# Patient Record
Sex: Female | Born: 1945 | Race: White | Hispanic: No | Smoking: Former smoker
Health system: Southern US, Community
[De-identification: ages and names within clinical notes are randomized; demographics above are authoritative.]

## PROBLEM LIST (undated history)

## (undated) DIAGNOSIS — I471 Supraventricular tachycardia, unspecified: Secondary | ICD-10-CM

## (undated) DIAGNOSIS — E039 Hypothyroidism, unspecified: Secondary | ICD-10-CM

## (undated) DIAGNOSIS — I313 Pericardial effusion (noninflammatory): Secondary | ICD-10-CM

## (undated) DIAGNOSIS — F419 Anxiety disorder, unspecified: Secondary | ICD-10-CM

## (undated) DIAGNOSIS — I3139 Other pericardial effusion (noninflammatory): Secondary | ICD-10-CM

## (undated) DIAGNOSIS — C3490 Malignant neoplasm of unspecified part of unspecified bronchus or lung: Secondary | ICD-10-CM

## (undated) DIAGNOSIS — J449 Chronic obstructive pulmonary disease, unspecified: Secondary | ICD-10-CM

## (undated) DIAGNOSIS — K219 Gastro-esophageal reflux disease without esophagitis: Secondary | ICD-10-CM

## (undated) HISTORY — PX: ADENOIDECTOMY: SUR15

## (undated) HISTORY — PX: OTHER SURGICAL HISTORY: SHX169

## (undated) HISTORY — DX: Other pericardial effusion (noninflammatory): I31.39

## (undated) HISTORY — DX: Pericardial effusion (noninflammatory): I31.3

## (undated) HISTORY — DX: Malignant neoplasm of unspecified part of unspecified bronchus or lung: C34.90

---

## 2008-05-21 ENCOUNTER — Encounter: Payer: Self-pay | Admitting: Cardiology

## 2008-06-09 ENCOUNTER — Encounter: Payer: Self-pay | Admitting: Cardiology

## 2009-08-15 ENCOUNTER — Encounter: Payer: Self-pay | Admitting: Cardiology

## 2009-08-19 ENCOUNTER — Encounter: Payer: Self-pay | Admitting: Cardiology

## 2009-11-07 ENCOUNTER — Encounter: Payer: Self-pay | Admitting: Cardiology

## 2009-12-15 ENCOUNTER — Encounter: Payer: Self-pay | Admitting: Cardiology

## 2009-12-30 ENCOUNTER — Encounter: Payer: Self-pay | Admitting: Cardiology

## 2009-12-30 ENCOUNTER — Ambulatory Visit: Payer: Self-pay | Admitting: Cardiology

## 2010-01-30 ENCOUNTER — Ambulatory Visit: Payer: Self-pay | Admitting: Cardiology

## 2010-01-30 DIAGNOSIS — K219 Gastro-esophageal reflux disease without esophagitis: Secondary | ICD-10-CM | POA: Insufficient documentation

## 2010-01-30 DIAGNOSIS — Z87891 Personal history of nicotine dependence: Secondary | ICD-10-CM

## 2010-01-30 DIAGNOSIS — I10 Essential (primary) hypertension: Secondary | ICD-10-CM

## 2010-01-30 DIAGNOSIS — I319 Disease of pericardium, unspecified: Secondary | ICD-10-CM | POA: Insufficient documentation

## 2010-02-16 ENCOUNTER — Encounter: Payer: Self-pay | Admitting: Cardiology

## 2010-02-16 ENCOUNTER — Ambulatory Visit: Payer: Self-pay | Admitting: Cardiology

## 2010-02-21 ENCOUNTER — Ambulatory Visit: Payer: Self-pay | Admitting: Cardiology

## 2010-02-23 ENCOUNTER — Telehealth (INDEPENDENT_AMBULATORY_CARE_PROVIDER_SITE_OTHER): Payer: Self-pay | Admitting: *Deleted

## 2010-02-28 ENCOUNTER — Telehealth: Payer: Self-pay | Admitting: Cardiology

## 2010-02-28 ENCOUNTER — Encounter: Payer: Self-pay | Admitting: Cardiology

## 2010-04-20 ENCOUNTER — Encounter: Payer: Self-pay | Admitting: Cardiology

## 2010-04-24 ENCOUNTER — Encounter: Payer: Self-pay | Admitting: Cardiology

## 2010-04-25 ENCOUNTER — Encounter (INDEPENDENT_AMBULATORY_CARE_PROVIDER_SITE_OTHER): Payer: Self-pay | Admitting: *Deleted

## 2010-06-09 ENCOUNTER — Telehealth (INDEPENDENT_AMBULATORY_CARE_PROVIDER_SITE_OTHER): Payer: Self-pay | Admitting: *Deleted

## 2010-07-19 ENCOUNTER — Ambulatory Visit: Payer: Self-pay | Admitting: Cardiology

## 2010-07-19 DIAGNOSIS — F411 Generalized anxiety disorder: Secondary | ICD-10-CM | POA: Insufficient documentation

## 2010-08-16 ENCOUNTER — Encounter: Payer: Self-pay | Admitting: Cardiology

## 2010-08-17 ENCOUNTER — Encounter: Payer: Self-pay | Admitting: Cardiology

## 2010-09-05 NOTE — Assessment & Plan Note (Signed)
Summary: NP-PERICARDIAL EFFUSION   Visit Type:  Initial Consult Primary Provider:  Dr. [facility]  CC:  fluid around heart per Dr. Cleone Slim.  History of Present Illness: the patient is a 65 year old femalehistory of small cell undifferentiated lung cancer in remission. The patient is status post chemotherapy which ended in March of 2010. She also had radiation therapy February of 2010. She has undergone prophylactic whole brain radiation at the time of the completion of her chemotherapy. During followup CT scans of the chest showed no evidence of cancer or at least for the last several months has been noted to have a large pericardial effusion. It was felt that this would be related to chemoradiation therapy. The patient denies any pleuritic chest pain and has been no history of pericardial count on.  Dr. Suzzette Righter patient for evaluation of her pericardial effusion.her recent echocardiogram done abruptly one month ago showed a relatively large circumferential pericardial effusion. There was evidence for gelatinous organization on the visceral surface consistent with a chronic process. There was no right ventricular compression or obvious variation in mitral inflow suggestive of physiology. There was some right atrial free wall inversion. Inferior vena cava was normal in size. Clinically also the patient has no evidence of a non-physiology.  The patient has not been heard for assessment of her pericardial effusion.  Preventive Screening-Counseling & Management  Alcohol-Tobacco     Smoking Status: quit     Year Quit: 06/2008  Current Problems (verified): 1)  Pericardial Effusion  (ICD-423.9) 2)  Chemotherapy Follow-up Examination  (ICD-V67.2) 3)  Esophageal Reflux  (ICD-530.81) 4)  Hypertension  (ICD-401.9) 5)  Pers Hx Tobacco Use Presenting Hazards Health  (ICD-V15.82) 6)  Fm Hx Malignant Neoplasm Trachea Bronchus&lung  (ICD-V16.1)  Current Medications (verified): 1)   Singulair 10 Mg Tabs (Montelukast Sodium) .... Take 1 Tablet By Mouth Once A Day 2)  Spiriva Handihaler 18 Mcg Caps (Tiotropium Bromide Monohydrate) .... One Inhalation Daily 3)  Ventolin Hfa 108 (90 Base) Mcg/act Aers (Albuterol Sulfate) .... As Needed 4)  Dexilant 60 Mg Cpdr (Dexlansoprazole) .... Take 1 Tablet By Mouth Once A Day 5)  Amlodipine Besylate 5 Mg Tabs (Amlodipine Besylate) .... Take 1 Tablet By Mouth Once A Day 6)  Hydrocodone-Acetaminophen 5-500 Mg Tabs (Hydrocodone-Acetaminophen) .... Take One By Mouth Every Six Hours As Needed 7)  Remeron 30 Mg Tabs (Mirtazapine) .... Take 1 Tablet By Mouth Once A Day 8)  Alprazolam 0.5 Mg Tabs (Alprazolam) .... Take 1 Tablet By Mouth Two Times A Day As Needed 9)  Multivitamins  Tabs (Multiple Vitamin) .... Take 1 Tablet By Mouth Once A Day  Allergies (verified): 1)  ! Ibuprofen  Comments:  Nurse/Medical Assistant: The patient's medications and allergies were verbally reviewed with the patient and were updated in the Medication and Allergy Lists.  Past History:  Family History: Last updated: 02-25-2010 patient's father died of lung cancer. Paternal aunt had stomach cancer and her children smoke.  Social History: Last updated: 2010/02/25 patient had 2 years of college. She did well. 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  Risk Factors: Smoking Status: quit (25-Feb-2010)  Past Medical History: small cell undifferentiated lung cancer Status post chemoradiation therapy Status post prophylactic whole brain radiation. Selective and adenoidectomy 5 cataract surgery  Family History: Reviewed history and no changes required. patient's father died of lung cancer. Paternal aunt had stomach cancer and her children smoke.  Social History:  Reviewed history and no changes required. patient had 2 years of college. She did well. 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    Vital Signs:  Patient profile:   65 year old female Height:      61 inches Weight:      135 pounds BMI:     25.60 Pulse rate:   65 / minute BP sitting:   103 / 67  (left arm) Cuff size:   regular  Vitals Entered By: Carlye Grippe (January 30, 2010 10:43 AM)  Nutrition Counseling: Patient's BMI is greater than 25 and therefore counseled on weight management options. CC: fluid around heart per Dr. Cleone Slim   Physical Exam  Additional Exam:  General: Well-developed, well-nourished in no distress head: Normocephalic and atraumatic eyes PERRLA/EOMI intact, conjunctiva and lids normal nose: No deformity or lesions mouth normal dentition, normal posterior pharynx neck: Supple, no JVD.  No masses, thyromegaly or abnormal cervical nodes lungs: Normal breath sounds bilaterally without wheezing.  Normal percussion heart: regular rate and rhythm with normal S1 and S2, no S3 or S4.  PMI is normal.  No pathological murmurs abdomen: Normal bowel sounds, abdomen is soft and nontender without masses, organomegaly or hernias noted.  No hepatosplenomegaly musculoskeletal: Back normal, normal gait muscle strength and tone normal pulsus: Pulse is normal in all 4 extremities Extremities: No peripheral pitting edema neurologic: Alert and oriented x 3 skin: Intact without lesions or rashes cervical  nodes: No significant adenopathy psychologic: Normal affect    Impression & Recommendations:  Problem # 1:  PERICARDIAL EFFUSION (ICD-423.9) I had a long discussion with the patient regarding the possible etiology and management of her pericardial effusion. I told her that I would personally reviewed a recent echocardiogram. We will also order a followup echocardiogram in one month. The etiology of this effusion is not clear. Because the patient is in remission from her lung cancer it seems unlikely this would be a malignant effusion but certainly cannot be ruled out. It will also need to be followed from a hemodynamic perspective. I told the patient that I would have a low threshold to proceed with a subxiphoid window for drainage and diagnostic indications. We will make his decision on the followup echocardiogram in one month.  Problem # 2:  CHEMOTHERAPY FOLLOW-UP EXAMINATION (ICD-V67.2) the patient is followed in the oncology clinic  Problem # 3:  FM HX MALIGNANT NEOPLASM TRACHEA BRONCHUS&LUNG (ICD-V16.1) patient has small cell undifferentiated lung cancer in remission.  Other Orders: 2-D Echocardiogram (2D Echo)  Patient Instructions: 1)  2D Echo in one month just before next visit 2)  Follow up in  1 month

## 2010-09-05 NOTE — Letter (Signed)
Summary: Engineer, materials at [facility]  [address]   [place], [place] [address]   Phone: [phone]  Fax: [phone]        April 25, 2010 MRN: [id number]   [facility] [address] [place], [place]  [address]   Dear Ms. Seebeck,  Your test ordered by Selena Batten has been reviewed by your physician (or physician assistant) and was found to be normal or stable. Your physician (or physician assistant) felt no changes were needed at this time.  ____ Echocardiogram  ____ Cardiac Stress Test  ____ Lab Work  ____ Peripheral vascular study of arms, legs or neck  __X__ CT scan or X-ray (chest)  ____ Lung or Breathing test  ____ Other:   Thank you.   Hoover Brunette, LPN    Duane Boston, M.D., F.A.C.C. Thressa Sheller, M.D., F.A.C.C. Oneal Grout, M.D., F.A.C.C. Cheree Ditto, M.D., F.A.C.C. Daiva Nakayama, M.D., F.A.C.C. Kenney Houseman, M.D., F.A.C.C. Jeanne Ivan, PA-C

## 2010-09-05 NOTE — Letter (Signed)
Summary: External Correspondence/ [facility]  External Correspondence/ [facility]   Imported By: Dorise Hiss 04/26/2010 15:11:23  _____________________________________________________________________  External Attachment:    Type:   Image     Comment:   External Document

## 2010-09-05 NOTE — Progress Notes (Signed)
Summary: Referral pericardial drainage.  ---- Converted from flag ---- ---- 02/21/2010 2:00 PM, Lewayne Bunting, MD, [facility] wrote: Schedule subxyphoid window [facility]. Done ------------------------------

## 2010-09-05 NOTE — Progress Notes (Signed)
Summary: ?appt to remove fluid from her heart   Phone Note Outgoing Call Call back at Home Phone [phone]   Call placed by: Carlye Grippe,  February 23, 2010 11:39 AM Call placed to: Patient Summary of Call: called to inquire about PCP info and patient asked when her appt is going to be r/e removing fluid from her heart? Nurse informed patient that nothing has been scheduled at this time but we would f/u on this and let her know.  Initial call taken by: Carlye Grippe,  February 23, 2010 11:41 AM  Follow-up for Phone Call        Let her know I treied to call Dr. Pat Patrick with [facility] in [place]. He has not called me back will try again tomorrow. Please let her know I am working on it to get her Careers adviser in [place].  Follow-up by: Lewayne Bunting, MD, [facility],  February 23, 2010 4:47 PM  Additional Follow-up for Phone Call Additional follow up Details #1::        Patient informed of the above.  Additional Follow-up by: Carlye Grippe,  February 24, 2010 10:32 AM

## 2010-09-05 NOTE — Assessment & Plan Note (Signed)
Summary: 1 MONTH FU-REVIEW ECHO   Visit Type:  Follow-up Primary Provider:  Dr. Amend([facility]   History of Present Illness: the patient is a 65 year old female with a history of small cell undifferentiated lung cancer which is in remission. Please see my previous note regarding details of her chemotherapy and radiation therapy. The patient has been referred last month for an evaluation of a pericardial effusion. She was found to have a large circumferential effusion with gelatinous organization on the visceral surface consistent with a chronic process. There was no right compression or obvious variation in mitral inflow pattern suggestive of time but not physiology. There was some right atrial free wall inversion. The patient had a repeat study done on July 14 which was essentially unchanged. In the interim however the patient's blood pressure has gradually decreased today in the office was 92/61. I also performed a pulsus paradoxus with a blood pressure cuff and it was approximately 20 mm of mercury. The patient is not tachycardic and she reports no dizziness or orthostatic symptoms.  Etiology of the patient's pericardial effusion is not clear, it would be somewhat unusual if this was a malignant effusion in light of her cancer being in remission. However I told the patient that there does appear to be some effect of this effusion on her blood pressure i.e. increased pulsus paradoxus despite the negative echocardiographic criteria. I told her that I do not anticipate this effusion will get smaller spontaneously. I still question whether this could be a malignant effusion.  Preventive Screening-Counseling & Management  Alcohol-Tobacco     Smoking Status: quit  Comments: quit smoking Nov 2009, smoked for about 40 yrs. chews nicotine gum  Current Medications (verified): 1)  Singulair 10 Mg Tabs (Montelukast Sodium) .... Take 1 Tablet By Mouth Once A Day 2)  Spiriva Handihaler 18  Mcg Caps (Tiotropium Bromide Monohydrate) .... One Inhalation Daily 3)  Ventolin Hfa 108 (90 Base) Mcg/act Aers (Albuterol Sulfate) .... As Needed 4)  Dexilant 60 Mg Cpdr (Dexlansoprazole) .... Take 1 Tablet By Mouth Once A Day 5)  Hydrocodone-Acetaminophen 5-500 Mg Tabs (Hydrocodone-Acetaminophen) .... Take One By Mouth Every Six Hours As Needed 6)  Remeron 15 Mg Tabs (Mirtazapine) .... Take 1 Tablet By Mouth Once A Day 7)  Alprazolam 0.5 Mg Tabs (Alprazolam) .... Take 1 Tablet By Mouth Two Times A Day As Needed 8)  Multivitamins  Tabs (Multiple Vitamin) .... Take 1 Tablet By Mouth Once A Day  Allergies: 1)  ! Ibuprofen  Comments:  Nurse/Medical Assistant: The patient's medications were reviewed with the patient and were updated in the Medication List. Pt verbally confirmed medications.  Cyril Loosen, RN, BSN (February 21, 2010 1:26 PM)  Past History:  Past Medical History: Last updated: February 03, 2010 small cell undifferentiated lung cancer Status post chemoradiation therapy Status post prophylactic whole brain radiation. Selective and adenoidectomy 5 cataract surgery  Past Surgical History: Last updated: 2010-02-03 Cataract Extraction Tonsillectomy LEEP TWICE FOR ABNORMALITIES OF HER CERVIX Chemotherapy  Family History: Last updated: 02/03/2010 patient's father died of lung cancer. Paternal aunt had stomach cancer and her children smoke.  Social History: Last updated: 02/03/10 patient had 2 years of college. She did well. 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  Review of Systems  The patient denies fatigue, malaise, fever, weight gain/loss, vision loss, decreased hearing, hoarseness, chest pain, palpitations, shortness of breath, prolonged cough, wheezing, sleep apnea, coughing up blood,  abdominal pain, blood in stool, nausea, vomiting, diarrhea, heartburn, incontinence, blood in urine, muscle weakness,  joint pain, leg swelling, rash, skin lesions, headache, fainting, dizziness, depression, anxiety, enlarged lymph nodes, easy bruising or bleeding, and environmental allergies.    Vital Signs:  Patient profile:   65 year old female Height:      61 inches Weight:      134.50 pounds BMI:     25.51 Pulse rate:   73 / minute BP sitting:   92 / 61  (left arm) Cuff size:   regular  Vitals Entered By: Cyril Loosen, RN, BSN (February 21, 2010 1:22 PM)  Nutrition Counseling: Patient's BMI is greater than 25 and therefore counseled on weight management options. Comments follow up echo   Physical Exam  Additional Exam:  General: Well-developed, well-nourished in no distress, also start oxacillin 2 mmHg head: Normocephalic and atraumatic eyes PERRLA/EOMI intact, conjunctiva and lids normal nose: No deformity or lesions mouth normal dentition, normal posterior pharynx neck: Supple, no JVD.  No masses, thyromegaly or abnormal cervical nodes lungs: Normal breath sounds bilaterally without wheezing.  Normal percussion heart: regular rate and rhythm with normal S1 and S2, no S3 or S4.  PMI is normal.  No pathological murmurs abdomen: Normal bowel sounds, abdomen is soft and nontender without masses, organomegaly or hernias noted.  No hepatosplenomegaly musculoskeletal: Back normal, normal gait muscle strength and tone normal pulsus: Pulse is normal in all 4 extremities Extremities: No peripheral pitting edema neurologic: Alert and oriented x 3 skin: Intact without lesions or rashes cervical nodes: No significant adenopathy psychologic: Normal affect    EKG  Procedure date:  02/21/2010  Findings:      low-voltage QRS normal sinus rhythm. Heart rate 73 beats per minute  Impression & Recommendations:  Problem # 1:  PERICARDIAL EFFUSION (ICD-423.9) I told the patient that I would recommend a diagnostic and possibly therapeutic pericardial drainage procedure. The patient does not want to have  this done in [place] due to the distance from [place]. I told her that Dr. Lady Gary a former colleague of mine , and probably could refer me to a cardiovascular surgeon in [place]. I do not feel this should be approached by a needle pericardiocentesis but rather to a surgical subxiphoid window approach. I discussed some of the risks associated with this procedure but the patient ultimately she needs to have a consultation with cardiac surgeon. I will try to set this up at the earliest convenient time for the patient.we will also stop amlodipine in the meanwhile Orders: EKG w/ Interpretation (93000)  Problem # 2:  FM HX MALIGNANT NEOPLASM TRACHEA BRONCHUS&LUNG (ICD-V16.1) see details in my prior note.  Patient Instructions: 1)  Stop Amlodipine 2)  Follow up in  2 months

## 2010-09-05 NOTE — Letter (Signed)
Summary: External Correspondence/ PROGRESS REPORT [facility]   External Correspondence/ PROGRESS REPORT [facility]   Imported By: Dorise Hiss 12/27/2009 15:00:03  _____________________________________________________________________  External Attachment:    Type:   Image     Comment:   External Document

## 2010-09-05 NOTE — Consult Note (Signed)
Summary: Consultation Report/ ONCOLOGY  Consultation Report/ ONCOLOGY   Imported By: Dorise Hiss 12/28/2009 10:32:28  _____________________________________________________________________  External Attachment:    Type:   Image     Comment:   External Document

## 2010-09-05 NOTE — Letter (Signed)
Summary: External Correspondence/ FAXED [facility]  External Correspondence/ [facility]XED [facility]   Imported By: Dorise Hiss 03/17/2010 10:04:34  _____________________________________________________________________  External Attachment:    Type:   Image     Comment:   External Document

## 2010-09-05 NOTE — Progress Notes (Signed)
Summary: patient cxl appt  Phone Note Call from Patient Call back at Home Phone [phone]   Caller: Patient Reason for Call: Talk to Nurse Summary of Call: patient called to cxl appt due to getting a letter from "[place]?" stating that we no longer need to see her... Initial call taken by: Claudette Laws,  June 09, 2010 12:33 PM  Follow-up for Phone Call        Advised pt that the only letter documented in her chart was a normal letter for her chest x-ray.  Advised her that she did need to keep OV for 11/8.  Stated she has appt. with GYN and would need to reschedule.  Rescheduled for 12/14.  Also, states that surgeon does not need to see her anymore.   Follow-up by: Hoover Brunette, LPN,  June 12, 2010 11:19 AM

## 2010-09-05 NOTE — Miscellaneous (Signed)
Summary: Echocardiogram  Clinical Lists Changes  Observations: Added new observation of ECHOINTERP: Conclusions: 1. The left ventricular chamber size is normal. There is no left ventricular hypertrophy. Global left ventricular wall motion and contractility are within normal limits. The estimated ejection fraction is 60-65%. Abnormal left ventricular diastolic filling is observed, consistent with impaired relaxation.   2. The mitral valve leaflets are mildly thickened. There is flattened closure of the mitral valve leaflets. There is mild mitral regurgitation.   3. The aortic valve leaflets are mildly thickened. There is mild aortic regurgitation. The mean gradient of the aortic valve is 6 mmHg.   4. There is a trace tricuspid regurgitation.   5. There is a large circumferential pericardial effusion with some gelatinous organization at visceral surface. No obvious significant changes in mitral inflow to suggest hemodynamic compromise. No right ventricular collapse. Mild to moderate right atrial free wall undulation without collapse. Could not locate last study for direct comparison - previously described as large.  6. There is a greater than 50% respiratory change in the inferior vena cava dimension.    (02/16/2010 12:07)      Echocardiogram  Procedure date:  02/16/2010  Findings:      Conclusions: 1. The left ventricular chamber size is normal. There is no left ventricular hypertrophy. Global left ventricular wall motion and contractility are within normal limits. The estimated ejection fraction is 60-65%. Abnormal left ventricular diastolic filling is observed, consistent with impaired relaxation.   2. The mitral valve leaflets are mildly thickened. There is flattened closure of the mitral valve leaflets. There is mild mitral regurgitation.   3. The aortic valve leaflets are mildly thickened. There is mild aortic regurgitation. The mean gradient of the aortic valve is 6 mmHg.   4. There is  a trace tricuspid regurgitation.   5. There is a large circumferential pericardial effusion with some gelatinous organization at visceral surface. No obvious significant changes in mitral inflow to suggest hemodynamic compromise. No right ventricular collapse. Mild to moderate right atrial free wall undulation without collapse. Could not locate last study for direct comparison - previously described as large.  6. There is a greater than 50% respiratory change in the inferior vena cava dimension.     Appended Document: Echocardiogram will need to review echocardiogram.  However, make an appointment for patient weight cardiac surgery for diagnostic and drainage of a large pericardial effusion via a subxiphoid approach.  Patient can discuss either in the office or on the phone.  He can report to her that I feel this effusion needs to be drained to make sure is not malignant and also because it's quite large.  Appended Document: Echocardiogram Left message to return call.   Appended Document: Echocardiogram Patient in office on 7/19.

## 2010-09-07 NOTE — Letter (Signed)
Summary: External Correspondence/ [facility]  External Correspondence/ [facility]   Imported By: Dorise Hiss 08/18/2010 10:01:07  _____________________________________________________________________  External Attachment:    Type:   Image     Comment:   External Document

## 2010-09-07 NOTE — Assessment & Plan Note (Signed)
Summary: [id number]   Visit Type:  Follow-up Primary Provider:  Dr. Amin([facility]   History of Present Illness: the patient is a 65 old female with a history of small cell undifferentiated lung cancer which is in remission. She has finished her chemoradiation therapy now more than a year ago. We have been following the patient for a chronic pericardial effusion which has been described in the past last large with gelatinous organization on the visceral surface consistent with a chronic process. Previously there was no evidence of time but not.due to my concern of a possible malignant effusion I referred patient to cardiac thoracic surgery in [facility] to see Dr. Lindie Spruce for consideration of a diagnostic and therapeutic pericardial drainage procedure. A repeat CT scan was obtained and it was felt that the effusion was smaller and therefore decreased patient was treated conservatively and no drainage procedure was performed. Please see the patient's blood pressure been running somewhat low but does seem to have self corrected itself. We did stop previously her Norvasc. The patient reports no palpitations presyncope or syncope.  The patient's main complaint is fatigue and decreased appetite. She has frequent ruminating thoughts and feels torn between her family in [place] and hearing even. She also has difficulty sleeping. The patient exhibits some features of agoraphobia. She has frequent anxiety episodes.  Cardiovascular standpoint however she seems to be doing well. She reports no chest pain shortness of breath orthopnea or PND.  Preventive Screening-Counseling & Management  Alcohol-Tobacco     Smoking Status: quit     Year Quit: 2009   Current Medications (verified): 1)  Singulair 10 Mg Tabs (Montelukast Sodium) .... Take 1 Tablet By Mouth Once A Day 2)  Spiriva Handihaler 18 Mcg Caps (Tiotropium Bromide Monohydrate) .... One Inhalation Daily 3)  Ventolin Hfa 108 (90 Base) Mcg/act Aers  (Albuterol Sulfate) .... As Needed 4)  Dexilant 60 Mg Cpdr (Dexlansoprazole) .... Take 1 Tablet By Mouth Once A Day 5)  Hydrocodone-Acetaminophen 5-500 Mg Tabs (Hydrocodone-Acetaminophen) .... Take One By Mouth Every Six Hours As Needed 6)  Remeron 15 Mg Tabs (Mirtazapine) .... Take 1 Tablet By Mouth Once A Day 7)  Alprazolam 0.5 Mg Tabs (Alprazolam) .... Take 1 Tablet By Mouth Two Times A Day As Needed 8)  Multivitamins  Tabs (Multiple Vitamin) .... Take 1 Tablet By Mouth Once A Day  Allergies (verified): 1)  ! Ibuprofen  Comments:  Nurse/Medical Assistant: The patient's medications and allergies were verbally reviewed with the patient and were updated in the Medication and Allergy Lists.  Past History:  Past Surgical History: Last updated: Feb 07, 2010 Cataract Extraction Tonsillectomy LEEP TWICE FOR ABNORMALITIES OF HER CERVIX Chemotherapy  Family History: Last updated: 02-07-2010 patient's father died of lung cancer. Paternal aunt had stomach cancer and her children smoke.  Social History: Last updated: 07-Feb-2010 patient had 2 years of college. She did well. 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  Risk Factors: Smoking Status: quit (07/19/2010)  Past Medical History: small cell undifferentiated lung cancer Status post chemoradiation therapy Status post prophylactic whole brain radiation. Selective and adenoidectomy 5 cataract surgery Chronic pericardial effusion evaluated by cardiothoracic surgery July 2011 with decision to follow clinically  Review of Systems       The patient complains of fatigue, malaise, weight gain/loss, and anxiety.  The patient denies fever, vision loss, decreased hearing, hoarseness, chest pain, palpitations, shortness of breath, prolonged cough, wheezing, sleep  apnea, coughing up blood, abdominal pain, blood in stool, nausea, vomiting, diarrhea, heartburn, incontinence, blood  in urine, muscle weakness, joint pain, leg swelling, rash, skin lesions, headache, fainting, dizziness, depression, enlarged lymph nodes, easy bruising or bleeding, and environmental allergies.    Vital Signs:  Patient profile:   65 year old female Height:      61 inches Weight:      127 pounds Pulse rate:   77 / minute BP sitting:   130 / 74  (left arm) Cuff size:   regular  Vitals Entered By: Carlye Grippe (July 19, 2010 9:33 AM)  Physical Exam  Additional Exam:  General: Well-developed, well-nourished in no distress, also start oxacillin 2 mmHg head: Normocephalic and atraumatic eyes PERRLA/EOMI intact, conjunctiva and lids normal nose: No deformity or lesions mouth normal dentition, normal posterior pharynx neck: Supple, no JVD.  No masses, thyromegaly or abnormal cervical nodes lungs: Normal breath sounds bilaterally without wheezing.  Normal percussion heart: regular rate and rhythm with normal S1 and S2, no S3 or S4.  PMI is normal.  No pathological murmurs abdomen: Normal bowel sounds, abdomen is soft and nontender without masses, organomegaly or hernias noted.  No hepatosplenomegaly musculoskeletal: Back normal, normal gait muscle strength and tone normal pulsus: Pulse is normal in all 4 extremities Extremities: No peripheral pitting edema neurologic: Alert and oriented x 3 skin: Intact without lesions or rashes cervical nodes: No significant adenopathy psychologic: Normal affect    Impression & Recommendations:  Problem # 1:  PERICARDIAL EFFUSION (ICD-423.9) the patient will have a follow up echocardiogram in January to make sure her pericardial effusion has not enlarged. Her lung cancer appears to be in remission.there appeared to be no untoward hemodynamic effects. Orders: 2-D Echocardiogram (2D Echo)  Problem # 2:  HYPERTENSION (ICD-401.9) the patient previously was hypotensive. Her blood pressure has normalized. She remains off Norvasc.  Problem # 3:  FM  HX MALIGNANT NEOPLASM TRACHEA BRONCHUS&LUNG (ICD-V16.1) the patient is followed by oncology. She appears to be in remission  Problem # 4:  ANXIETY DISORDER (ICD-300.00) the patient has a significant anxiety disorder with ruminating thoughts, agoraphobia associated fatigue and decreased appetite. I told her that p.r.n. Xanax is not ideal treatment due to the fact that it's short-acting. I recommended that she discuss with her primary care physician to take clonazepam or an alternative long acting benzodiazepine twice a day. She also significant insomnia and her Remeron can be increased or she could be switched to trazodone. I asked the patient to discuss this with her primary care physician.  Patient Instructions: 1)  Echo on 1/11/201 2)  Change meds as suggested - give note to PMD. 3)  Follow up in  6 months

## 2010-09-20 ENCOUNTER — Encounter: Payer: Self-pay | Admitting: Cardiology

## 2010-09-21 ENCOUNTER — Encounter: Payer: Self-pay | Admitting: Cardiology

## 2010-10-12 NOTE — Letter (Signed)
Summary: External Correspondence/ [facility]  External Correspondence/ [facility]   Imported By: Dorise Hiss 10/04/2010 12:25:56  _____________________________________________________________________  External Attachment:    Type:   Image     Comment:   External Document

## 2011-01-18 ENCOUNTER — Encounter: Payer: Self-pay | Admitting: Cardiology

## 2011-03-01 ENCOUNTER — Ambulatory Visit (INDEPENDENT_AMBULATORY_CARE_PROVIDER_SITE_OTHER): Payer: Medicare Other | Admitting: Cardiology

## 2011-03-01 ENCOUNTER — Encounter: Payer: Self-pay | Admitting: Cardiology

## 2011-03-01 VITALS — BP 113/75 | HR 72 | Ht 61.0 in | Wt 151.5 lb

## 2011-03-01 DIAGNOSIS — I319 Disease of pericardium, unspecified: Secondary | ICD-10-CM

## 2011-03-01 DIAGNOSIS — C3490 Malignant neoplasm of unspecified part of unspecified bronchus or lung: Secondary | ICD-10-CM

## 2011-03-01 DIAGNOSIS — I10 Essential (primary) hypertension: Secondary | ICD-10-CM

## 2011-03-01 NOTE — Patient Instructions (Signed)
   Patient will call back when ready to move forward with pericardial drainage. Your physician wants you to follow up in: 6 months.  You will receive a reminder letter in the mail one-two months in advance.  If you don't receive a letter, please call our office to schedule the follow up appointment

## 2011-03-06 DIAGNOSIS — C3490 Malignant neoplasm of unspecified part of unspecified bronchus or lung: Secondary | ICD-10-CM | POA: Insufficient documentation

## 2011-03-06 NOTE — Assessment & Plan Note (Signed)
Status post recent CT scan for followup stable. Followup oncology

## 2011-03-06 NOTE — Assessment & Plan Note (Signed)
Blood pressure well controlled. No change in medications 

## 2011-03-06 NOTE — Progress Notes (Signed)
HPI The patient is a 65 year old female with a history of small undifferentiated lung cancer which is in remission. She completed chemotherapy almost 2 years ago. The patient has a chronic pericardial effusion which in the past has been described as large with gelatinous organization on the visceral surface consistent with a chronic process. There has always been the concern of a malignant effusion. Asher referred in the past the patient to [facility] as per her preference for consideration for diagnostic and therapeutic pericardial drainage procedure. After [facility] however repeat CT scan was obtained was felt that the effusion smaller and therefore a decision was made to treat the patient conservatively in cardiac surgery at [facility] decided not to perform any further procedures. In the past her blood pressure has been low and I stopped Norvasc. She now follows up for a routine visit. She had a recent CT scan done in June of 2012 which again described a large pericardial effusion. There was some resolution of prior note groundglass opacities within the right medial metoprolol he was also scarring adjacent to the left hilum and in the superior segment of the left lower lobe which was felt to be stable and there was a remaining tiny right pleural effusion. The patient reports chronic dyspnea. Particularly during hot weather increase humidity she feels quite short of breath. It appears that her symptoms have somewhat worsened compared to her last visit. I did a bedside echocardiogram showed an ejection fraction of 60-65% with a large circumferential pericardial effusion which measures laterally 2.7 cm. There was no obvious RV collapse during diastole but this was a limited study performed with the pocket size GE V-scan.   Allergies  Allergen Reactions  . Ibuprofen     REACTION: throat swelling    Current Outpatient Prescriptions on File Prior to Visit  Medication Sig Dispense Refill  . albuterol (VENTOLIN  HFA) 108 (90 BASE) MCG/ACT inhaler Inhale 1 puff into the lungs as needed.        . ALPRAZolam (XANAX) 0.5 MG tablet Take 0.5 mg by mouth 3 (three) times daily as needed.       Marland Kitchen dexlansoprazole (DEXILANT) 60 MG capsule Take 60 mg by mouth daily.        Marland Kitchen HYDROcodone-acetaminophen (VICODIN) 5-500 MG per tablet Take 1 tablet by mouth every 6 (six) hours as needed.        . montelukast (SINGULAIR) 10 MG tablet Take 10 mg by mouth daily.        . Multiple Vitamins-Minerals (MULTIVITAL) tablet Take 1 tablet by mouth daily.        Marland Kitchen tiotropium (SPIRIVA HANDIHALER) 18 MCG inhalation capsule Place 18 mcg into inhaler and inhale daily.          Past Medical History  Diagnosis Date  . Lung cancer     small cell undifferentiated  . S/P adenoidectomy   . S/P cataract surgery     5x  . Pericardial effusion     No past surgical history on file.  Family History  Problem Relation Age of Onset  . Lung cancer Father   . Stomach cancer Paternal Aunt     History   Social History  . Marital Status: Divorced    Spouse Name: N/A    Number of Children: N/A  . Years of Education: N/A   Occupational History  . Not on file.   Social History Main Topics  . Smoking status: Former Smoker    Types: Cigarettes    Quit  date: 06/06/2008  . Smokeless tobacco: Never Used  . Alcohol Use: No  . Drug Use: No  . Sexually Active: Not on file   Other Topics Concern  . Not on file   Social History Narrative   Patient had 2 years of college and did well. 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    [id number] positives as outlined above. The remainder of the 18  point review of systems is negative  PHYSICAL EXAM BP 113/75  Pulse 72  Ht 5\' 1"  (1.549 m)  Wt 151 lb 8 oz (68.72 kg)  BMI 28.63 kg/m2  General: Well-developed, well-nourished in no distress Head: Normocephalic and atraumatic Eyes:PERRLA/EOMI intact, conjunctiva and lids  normal Ears: No deformity or lesions Mouth:normal dentition, normal posterior pharynx Neck: Supple, JVP is approximately 9 cm. There is abdominal jugular reflux.  No masses, thyromegaly or abnormal cervical nodes Lungs: Normal breath sounds bilaterally without wheezing.  Normal percussion Cardiac: regular rate and rhythm with normal S1 and S2, no S3 or S4.  PMI is normal.  No pathological murmurs Abdomen: Normal bowel sounds, abdomen is soft and nontender without masses, organomegaly or hernias noted.  No hepatosplenomegaly MSK: Back normal, normal gait muscle strength and tone normal Vascular: Pulse is normal in all 4 extremities Extremities: No peripheral pitting edema Neurologic: Alert and oriented x 3 Skin: Intact without lesions or rashes Lymphatics: No significant adenopathy Psychologic: Normal affect   ECG: Normal sinus rhythm low-voltage EKG  ASSESSMENT AND PLAN

## 2011-03-06 NOTE — Assessment & Plan Note (Signed)
The patient's pericardial effusion remains very large. There is no obvious hemodynamic compromise because of its chronicity. However given the patient's prior history of malignancy I still feel it is imperative that we proceed with pericardial drainage which can be best accomplished with a subxiphoid window. The effusion sufficiently large enough at this point in time that there is no real reason for further delay in at least removing the effusion. I think this can be done at low risk and I will refer the patient to cardiac surgery for consultation.

## 2011-05-01 ENCOUNTER — Telehealth: Payer: Self-pay | Admitting: Cardiology

## 2011-05-01 NOTE — Telephone Encounter (Signed)
Patient call me on cell phone this morning. She wants to proceed with pericardial drainage and a consultation visit with cardiothoracic surgery. Please arrange as soon as possible.

## 2011-05-07 NOTE — Telephone Encounter (Signed)
Has this referral been done?

## 2011-05-08 NOTE — Telephone Encounter (Signed)
Patient notified that her referral will be sent today to [facility] in [place].  They will contact her with appointment.

## 2011-05-09 ENCOUNTER — Other Ambulatory Visit: Payer: Self-pay | Admitting: *Deleted

## 2011-05-09 DIAGNOSIS — I313 Pericardial effusion (noninflammatory): Secondary | ICD-10-CM

## 2011-05-18 ENCOUNTER — Telehealth: Payer: Self-pay | Admitting: *Deleted

## 2011-05-18 DIAGNOSIS — I313 Pericardial effusion (noninflammatory): Secondary | ICD-10-CM

## 2011-05-18 NOTE — Telephone Encounter (Signed)
Patient needs to have formal echo before seeing cardiothoracic surgery for pericardial effusion.    10/5 - 12:05 - voice mail not set up  Left message to return call.

## 2011-05-23 ENCOUNTER — Other Ambulatory Visit: Payer: Self-pay | Admitting: Cardiology

## 2011-05-23 ENCOUNTER — Encounter: Payer: Self-pay | Admitting: *Deleted

## 2011-05-23 ENCOUNTER — Telehealth: Payer: Self-pay | Admitting: *Deleted

## 2011-05-23 ENCOUNTER — Telehealth: Payer: Self-pay | Admitting: Cardiology

## 2011-05-23 DIAGNOSIS — I313 Pericardial effusion (noninflammatory): Secondary | ICD-10-CM

## 2011-05-23 NOTE — Telephone Encounter (Signed)
Called Tina [name] to schedule her 2 D ECHO and she ask if she could have it done at [facility]. I scheduled her for 05-25-2011 @ 10AM. Verified with patient by telephone and she was in agreement with The date and time.

## 2011-05-23 NOTE — Telephone Encounter (Signed)
2 D ECHO scheduled for 05-25-2011 @ [facility] Checking percert

## 2011-05-23 NOTE — Telephone Encounter (Signed)
Epic shows Medicare and Medicare supplement.  No precert required.

## 2011-05-23 NOTE — Telephone Encounter (Signed)
Patient left message on voice mail to send any info for her in the mail.  She is having phone problems.    Will mail letter regarding below.

## 2011-05-25 DIAGNOSIS — I32 Pericarditis in diseases classified elsewhere: Secondary | ICD-10-CM

## 2011-05-29 ENCOUNTER — Ambulatory Visit: Payer: Medicare Other | Admitting: Cardiovascular Disease

## 2011-05-31 ENCOUNTER — Ambulatory Visit (INDEPENDENT_AMBULATORY_CARE_PROVIDER_SITE_OTHER): Payer: Medicare Other | Admitting: Cardiovascular Disease

## 2011-05-31 ENCOUNTER — Encounter: Payer: Self-pay | Admitting: Cardiovascular Disease

## 2011-05-31 ENCOUNTER — Encounter: Payer: Self-pay | Admitting: *Deleted

## 2011-05-31 DIAGNOSIS — I309 Acute pericarditis, unspecified: Secondary | ICD-10-CM

## 2011-05-31 DIAGNOSIS — R0602 Shortness of breath: Secondary | ICD-10-CM

## 2011-05-31 DIAGNOSIS — I319 Disease of pericardium, unspecified: Secondary | ICD-10-CM

## 2011-05-31 NOTE — Patient Instructions (Signed)
Your physician recommends that you go to the [facility] for lab work: DO TODAY! Pericardiocentesis scheduled at [facility] on Wed, June 06, 2011. Please see attached instruction sheet.

## 2011-05-31 NOTE — Progress Notes (Signed)
HPI  This is a 65 year old female who is here today for a followup visit regarding her pericardial effusion. She has a history of lung cancer which was diagnosed in 2010. It was treated with radiation and chemotherapy and has been in remission since then. She was noted on CT scans on multiple occasions to have large pericardial effusion. This has been followed by Dr. Earnestine Leys. She had an echocardiogram done last week which showed progression of the size of the effusion from before with early echocardiographic signs of tamponade. The etiology of the effusion is still not clear. However, she was diagnosed recently with severe hypothyroidism with a TSH above 100. She was started on Synthroid early this month. The patient has significant dyspnea with activities and also has been complaining of dizziness. There has been no syncope. She gets chest pain if she lies on her left side.  Allergies  Allergen Reactions  . Ibuprofen     REACTION: throat swelling     Current Outpatient Prescriptions on File Prior to Visit  Medication Sig Dispense Refill  . albuterol (VENTOLIN HFA) 108 (90 BASE) MCG/ACT inhaler Inhale 1 puff into the lungs as needed.        . ALPRAZolam (XANAX) 0.5 MG tablet Take 0.5 mg by mouth 3 (three) times daily as needed.       Marland Kitchen dexlansoprazole (DEXILANT) 60 MG capsule Take 60 mg by mouth daily as needed.       Marland Kitchen HYDROcodone-acetaminophen (VICODIN) 5-500 MG per tablet Take 1 tablet by mouth every 6 (six) hours as needed.        . montelukast (SINGULAIR) 10 MG tablet Take 10 mg by mouth daily.        . Multiple Vitamins-Minerals (MULTIVITAL) tablet Take 1 tablet by mouth daily.        Marland Kitchen tiotropium (SPIRIVA HANDIHALER) 18 MCG inhalation capsule Place 18 mcg into inhaler and inhale daily.           Past Medical History  Diagnosis Date  . Lung cancer     small cell undifferentiated  . S/P adenoidectomy   . S/P cataract surgery     5x  . Pericardial effusion      History reviewed.  No pertinent past surgical history.   Family History  Problem Relation Age of Onset  . Lung cancer Father   . Stomach cancer Paternal Aunt      History   Social History  . Marital Status: Divorced    Spouse Name: N/A    Number of Children: N/A  . Years of Education: N/A   Occupational History  . Not on file.   Social History Main Topics  . Smoking status: Former Smoker -- 1.0 packs/day for 30 years    Types: Cigarettes    Quit date: 06/06/2008  . Smokeless tobacco: Never Used  . Alcohol Use: No  . Drug Use: No  . Sexually Active: Not on file   Other Topics Concern  . Not on file   Social History Narrative   Patient had 2 years of college and did well. 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      PHYSICAL EXAM   BP 131/85  Pulse 72  Ht 5\' 1"  (1.549 m)  Wt 155 lb (70.308 kg)  BMI 29.29 kg/m2  SpO2 97%  Constitutional: She is oriented to person, place, and time. She appears well-developed and well-nourished. No distress.  HENT: No nasal discharge.  Head: Normocephalic and atraumatic.  Eyes: Pupils are equal, round, and reactive to light. Right eye exhibits no discharge. Left eye exhibits no discharge.  Neck: Normal range of motion. Neck supple. Mild JVD present. No thyromegaly present.  Cardiovascular: Normal rate, regular rhythm, normal heart sounds and intact distal pulses. Exam reveals no gallop and no friction rub.  No murmur heard.  Pulmonary/Chest: Effort normal and breath sounds normal. No stridor. No respiratory distress. She has no wheezes. She has no rales. She exhibits no tenderness.  Abdominal: Soft. Bowel sounds are normal. She exhibits no distension. There is no tenderness. There is no rebound and no guarding.  Musculoskeletal: Normal range of motion. She exhibits no edema and no tenderness.  Neurological: She is alert and oriented to person, place, and time. Coordination normal.    Skin: Skin is warm and dry. No rash noted. She is not diaphoretic. No erythema. No pallor.  Psychiatric: She has a normal mood and affect. Her behavior is normal. Judgment and thought content normal.      ASSESSMENT AND PLAN

## 2011-05-31 NOTE — Assessment & Plan Note (Signed)
The patient has very large pericardial effusion with early echocardiographic signs of tamponade. Clinically, there is no pulsus paradoxus by physical exam. She appears to be significantly dyspneic with symptoms of dizziness as well. The etiology of the effusion is still not clear but it could be due to hypothyroidism. Due to large size of the effusion with early hemodynamic compromise an echo as well as her significant symptoms of dyspnea and dizziness, I recommend proceeding with a diagnostic and therapeutic pericardiocentesis. Risks and benefits as well as alternatives were discussed with the patient.

## 2011-06-01 ENCOUNTER — Telehealth: Payer: Self-pay | Admitting: *Deleted

## 2011-06-01 NOTE — Telephone Encounter (Signed)
No precert required 

## 2011-06-01 NOTE — Telephone Encounter (Signed)
Pericardiocentesis at [facility] with Echo Tech present Main lab Dr. Kirke Corin 06/06/11 arrive 1030 for [id number] case   CHECKING PERCERT

## 2011-06-05 ENCOUNTER — Ambulatory Visit: Payer: Medicare Other | Admitting: Cardiovascular Disease

## 2011-06-05 ENCOUNTER — Encounter (HOSPITAL_COMMUNITY): Payer: Self-pay | Admitting: Pharmacy Technician

## 2011-06-06 ENCOUNTER — Inpatient Hospital Stay (HOSPITAL_COMMUNITY)
Admission: RE | Admit: 2011-06-06 | Discharge: 2011-06-08 | DRG: 316 | Disposition: A | Discharge status: Home / Self Care | Payer: Medicare Other | Source: Ambulatory Visit | Attending: Cardiovascular Disease | Admitting: Cardiovascular Disease

## 2011-06-06 ENCOUNTER — Other Ambulatory Visit: Payer: Self-pay | Admitting: Internal Medicine

## 2011-06-06 DIAGNOSIS — I517 Cardiomegaly: Secondary | ICD-10-CM

## 2011-06-06 DIAGNOSIS — I498 Other specified cardiac arrhythmias: Secondary | ICD-10-CM | POA: Diagnosis present

## 2011-06-06 DIAGNOSIS — Z85118 Personal history of other malignant neoplasm of bronchus and lung: Secondary | ICD-10-CM

## 2011-06-06 DIAGNOSIS — I319 Disease of pericardium, unspecified: Principal | ICD-10-CM | POA: Diagnosis present

## 2011-06-06 DIAGNOSIS — E039 Hypothyroidism, unspecified: Secondary | ICD-10-CM | POA: Diagnosis present

## 2011-06-06 LAB — GLUCOSE, SEROUS FLUID: Glucose, Fluid: 63 mg/dL

## 2011-06-06 LAB — CREATININE, FLUID (PLEURAL, PERITONEAL, JP DRAINAGE): Creat, Fluid: 0.9 mg/dL

## 2011-06-06 LAB — AMYLASE, BODY FLUID: Amylase, Fluid: 29 U/L

## 2011-06-06 LAB — BODY FLUID CELL COUNT WITH DIFFERENTIAL
Eos, Fluid: 0 %
Monocyte-Macrophage-Serous Fluid: 13 % — ABNORMAL LOW (ref 50–90)
Neutrophil Count, Fluid: 8 % (ref 0–25)
Other Cells, Fluid: 0 %

## 2011-06-07 DIAGNOSIS — I517 Cardiomegaly: Secondary | ICD-10-CM

## 2011-06-07 LAB — CHOLESTEROL, BODY FLUID: Cholesterol, Fluid: 265 mg/dL

## 2011-06-07 LAB — CBC
Hemoglobin: 11.3 g/dL — ABNORMAL LOW (ref 12.0–15.0)
MCH: 33.7 pg (ref 26.0–34.0)
MCHC: 33.1 g/dL (ref 30.0–36.0)
Platelets: 189 10*3/uL (ref 150–400)

## 2011-06-07 LAB — BASIC METABOLIC PANEL
Calcium: 8.7 mg/dL (ref 8.4–10.5)
Creatinine, Ser: 0.86 mg/dL (ref 0.50–1.10)
GFR calc non Af Amer: 68 mL/min — ABNORMAL LOW (ref >90)
Glucose, Bld: 112 mg/dL — ABNORMAL HIGH (ref 70–99)
Sodium: 141 mEq/L (ref 135–145)

## 2011-06-07 NOTE — Procedures (Signed)
  NAMEIFE, VITELLI NO.:  [id number]  MEDICAL RECORD NO.:  [id number]  LOCATION:  2502                         FACILITY:  [facility]  PHYSICIAN:  Lorine Bears, MD     DATE OF BIRTH:  09-02-1945  DATE OF PROCEDURE:  06/06/2011 DATE OF DISCHARGE:                   PERIPHERAL VASCULAR INVASIVE PROCEDURE   PRIMARY CARDIOLOGISTS IS:  Learta Codding, MD, [facility].  PROCEDURES PERFORMED:  Pericardiocentesis with fluoroscopic and ultrasound guidance.  INDICATION AND CLINICAL HISTORY:  This is a 65 year old female with a history of lung cancer, which was diagnosed and treated in 2010 with radiation therapy and chemotherapy.  The patient has been known to have a large pericardial effusion over the last year, which has progressed in size gradually.  Most recent echocardiogram showed early echocardiographic signs of tamponade.  She was also symptomatic with dizziness and significant exertional dyspnea.  Thus, pericardiocentesis was recommended.  Risks, benefits and alternatives were discussed with the patient.  STUDY DETAILS:  A standard informed consent was obtained.  The echocardiographic images were performed before the procedure, which confirmed the presence of large pericardial effusion.  The subxiphoid area was prepped in a sterile fashion.  It was anesthetized with 1% lidocaine.  The pericardiocentesis needle was used to access the pericardial space.  Straw-color fluid was retrieved.  The wire was advanced into the pericardial space.  I then removed the needle and advanced along 5-French dilator in the pericardial space.  This was done under fluoroscopy.  I then used agitated saline and injected that into the pericardial space with simultaneous echocardiographic images.  This confirmed that the catheter was clearly in the pericardial space.  This dilator was then removed over the wire and the pericardiocentesis drainage catheter was advanced under fluoroscopy.  We  took then samples of pericardial fluid to be sent for analysis.  This was then connected to a negative pressure vacuum bottle.  A total of 950 mL of straw- colored fluid was removed.  The patient tolerated the procedure well. However, at the end of the drainage, she started having frequent runs of supraventricular tachycardia, likely due to pericardial irritation.  She was given 5 mg of IV Lopressor.  The pericardial draining catheter was then removed after it was confirmed by echocardiogram that the pericardial effusion has resolved.  The patient will be admitted overnight and started on metoprolol.  STUDY CONCLUSIONS: 1. Successful pericardiocentesis with removal of 950 mL of straw-color     fluid.  Lab analysis is pending. 2. Short runs of supraventricular tachycardia, which will be treated     with IV and p.o. metoprolol. 3. The etiology of her pericardial effusion is likely due to radiation     therapy versus hypothyroidism.  It is less likely to be a malignant     in etiology.     Lorine Bears, MD     MA/MEDQ  D:  06/06/2011  T:  06/07/2011  Job:  [id number]  cc:   Learta Codding, MD,FACC  Electronically Signed by Lorine Bears MD on 06/07/2011 09:47:12 PM

## 2011-06-08 LAB — TOTAL BILIRUBIN, BODY FLUID: Total bilirubin, fluid: 0.2 mg/dL

## 2011-06-10 LAB — BODY FLUID CULTURE: Culture: NO GROWTH

## 2011-06-16 NOTE — Discharge Summary (Signed)
NAMEMarland Kitchen  Tina [name], Tina [name] NO.:  [id number]  MEDICAL RECORD NO.:  [id number]  LOCATION:  2502                         FACILITY:  [facility]  PHYSICIAN:  Luis Abed, MD, FACCDATE OF BIRTH:  03/08/1946  DATE OF ADMISSION:  06/06/2011 DATE OF DISCHARGE:  06/08/2011                              DISCHARGE SUMMARY   PRIMARY CARDIOLOGIST:  Learta Codding, MD, [facility]  PRIVATE CARE PROVIDER:  Sheliah Plane. Paschal Dopp, MD  DISCHARGE DIAGNOSIS:  Pericardial effusion.  SECONDARY DIAGNOSES: 1. History of undifferentiated small cell lung cancer diagnosed in     2010, treated with radiation and chemotherapy. 2. Severe hypothyroidism. 3. Status post adenoidectomy. 4. Status post cataract surgery.  ALLERGIES:  IBUPROFEN causes throat swelling.  PROCEDURES:  Pericardiocentesis performed on June 06, 2011, with removal of 950 mL of straw-colored fluid.  Cytology has shown reactive mesothelial cells - chronic inflammation.  HISTORY OF PRESENT ILLNESS:  A 65 year old female with the above problem list.  The patient has a history of pericardial effusion, which has been followed serially and noted on multiple CT scans in the setting of her lung cancer.  She had an echocardiogram performed approximately 2 weeks ago showing progression of signs of effusion with signs of tamponade. It was felt that the patient would require diagnostic and therapeutic pericardiocentesis for further evaluation.  HOSPITAL COURSE:  The patient was presented to the [facility] on June 06, 2011, and underwent successful pericardiocentesis with removal of 950 mL of straw-colored fluid.  Postprocedure, the patient did have pleuritic chest discomfort and was also noted to have borderline hypotension.  As a result, she was aggressively hydrated on June 07, 2011, with improvement in blood pressures.  Chest pain is also improved, though still persist on a low level while she has had no dyspnea with  ambulation.  Cytology has shown reactive mesothelial cells suggestive of chronic inflammation.  Plan to discharge the patient home today and I have added the colchicine therapy.  She will follow up with Dr. Kirke Corin in approximately 2 weeks and we will arrange for surveillance echos in the outpatient setting.  DISCHARGE LABORATORY DATA:  Hemoglobin 11.3, hematocrit 34.1, WBC 5.5, platelet 199.  Sodium 141, potassium 3.6, chloride 107, CO2 25, BUN 15, creatinine 0.6, glucose 112, calcium 8.7.  Pericardial fluid analysis showed an LDH of 541.  There were 2735 WBCs per field, 13 monocyte- macrophage.  Blood culture showed no WBCs, no organisms or growth. Cytology showed reactive mesothelial cells, chronic inflammation.  DISPOSITION:  The patient will be discharged home today in good condition.  FOLLOWUP PLANS AND APPOINTMENT:  She has follow up with Dr. Kirke Corin on June 22, 2011, at 2:30 p.m.  She has follow up with Dr. Nelson Chimes as previously scheduled.  DISCHARGE MEDICATIONS: 1. Colchicine 0.6 mg daily. 2. Albuterol inhaler 90 mcg 2 puffs q.4 hours p.r.n. 3. Dexilant 60 mg daily p.r.n. 4. Hydrocodone/CPAP 5/500 mg q.6 hours p.r.n. 5. Levothyroxine 50 mcg daily. 6. Mirtazapine 15 mg daily. 7. Multivitamin 1 tablet daily. 8. Sertraline 50 mg daily. 9. Singulair 10 mg daily. 10.Spiriva 18 mcg daily. 11.Xanax 0.5 mg t.i.d. p.r.n.  OUTSTANDING LABORATORY STUDIES:  Follow up echocardiogram in the outpatient setting.  DURATION OF DISCHARGE ENCOUNTER:  Forty five minutes including physician time.     Nicolasa Ducking, ANP   ______________________________ Luis Abed, MD, [facility]    CB/MEDQ  D:  06/08/2011  T:  06/08/2011  Job:  [id number]  cc:   Sheliah Plane. Paschal Dopp, MD  Electronically Signed by Nicolasa Ducking ANP on 06/09/2011 02:03:50 PM Electronically Signed by Willa Rough MD FACC on 06/16/2011 06:33:07 AM

## 2011-06-22 ENCOUNTER — Ambulatory Visit (INDEPENDENT_AMBULATORY_CARE_PROVIDER_SITE_OTHER): Payer: Medicare Other | Admitting: Cardiovascular Disease

## 2011-06-22 ENCOUNTER — Encounter: Payer: Self-pay | Admitting: Cardiovascular Disease

## 2011-06-22 VITALS — BP 120/77 | HR 65 | Ht 61.0 in | Wt 151.0 lb

## 2011-06-22 DIAGNOSIS — I318 Other specified diseases of pericardium: Secondary | ICD-10-CM

## 2011-06-22 DIAGNOSIS — I319 Disease of pericardium, unspecified: Secondary | ICD-10-CM

## 2011-06-22 NOTE — Progress Notes (Signed)
HPI  This is a 65 year old female who is here today for a followup visit. She recently underwent successful pericardiocentesis for chronic pericardial effusion that progressed to the point of early hemodynamic compromise on echocardiogram. A total of 950 mL if yellowish colored fluid was removed without complications. After the procedure, the patient had pleuritic chest pain and episodes of supraventricular tachycardia. She was initially treated with metoprolol but her blood pressure was on the low side and this was discontinued. Fluid analysis showed evidence of chronic inflammation without evidence of malignant cells. She was placed on colchicine before discharge. Overall she has been feeling much better. Her chest pain resolved. Her dyspnea improved significantly. She denies any palpitations.  Allergies  Allergen Reactions  . Ibuprofen     REACTION: throat swelling     Current Outpatient Prescriptions on File Prior to Visit  Medication Sig Dispense Refill  . albuterol (VENTOLIN HFA) 108 (90 BASE) MCG/ACT inhaler Inhale 1 puff into the lungs as needed.        . ALPRAZolam (XANAX) 0.5 MG tablet Take 0.5 mg by mouth 3 (three) times daily as needed.       Marland Kitchen dexlansoprazole (DEXILANT) 60 MG capsule Take 60 mg by mouth daily as needed.       Marland Kitchen HYDROcodone-acetaminophen (VICODIN) 5-500 MG per tablet Take 1 tablet by mouth every 6 (six) hours as needed.        Marland Kitchen levothyroxine (SYNTHROID, LEVOTHROID) 50 MCG tablet Take 50 mcg by mouth daily.        . mirtazapine (REMERON) 15 MG tablet Take 15 mg by mouth at bedtime.        . montelukast (SINGULAIR) 10 MG tablet Take 10 mg by mouth daily.        . Multiple Vitamins-Minerals (MULTIVITAL) tablet Take 1 tablet by mouth daily.        . sertraline (ZOLOFT) 50 MG tablet Take 50 mg by mouth daily.        Marland Kitchen tiotropium (SPIRIVA HANDIHALER) 18 MCG inhalation capsule Place 18 mcg into inhaler and inhale daily.           Past Medical History  Diagnosis Date    . Lung cancer     small cell undifferentiated  . S/P adenoidectomy   . S/P cataract surgery     5x  . Pericardial effusion      History reviewed. No pertinent past surgical history.   Family History  Problem Relation Age of Onset  . Lung cancer Father   . Stomach cancer Paternal Aunt      History   Social History  . Marital Status: Divorced    Spouse Name: N/A    Number of Children: N/A  . Years of Education: N/A   Occupational History  . Not on file.   Social History Main Topics  . Smoking status: Former Smoker -- 1.0 packs/day for 30 years    Types: Cigarettes    Quit date: 06/06/2008  . Smokeless tobacco: Never Used  . Alcohol Use: No  . Drug Use: No  . Sexually Active: Not on file   Other Topics Concern  . Not on file   Social History Narrative   Patient had 2 years of college and did well. 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      PHYSICAL EXAM   BP 120/77  Pulse 65  Ht 5\' 1"  (1.549 m)  Wt  151 lb (68.493 kg)  BMI 28.53 kg/m2  SpO2 98%  Constitutional: She is oriented to person, place, and time. She appears well-developed and well-nourished. No distress.  HENT: No nasal discharge.  Head: Normocephalic and atraumatic.  Eyes: Pupils are equal, round, and reactive to light. Right eye exhibits no discharge. Left eye exhibits no discharge.  Neck: Normal range of motion. Neck supple. No JVD present. No thyromegaly present.  Cardiovascular: Normal rate, regular rhythm, normal heart sounds and intact distal pulses. Exam reveals no gallop and no friction rub.  No murmur heard.  Pulmonary/Chest: Effort normal and breath sounds normal. No stridor. No respiratory distress. She has no wheezes. She has no rales. She exhibits no tenderness.  Abdominal: Soft. Bowel sounds are normal. She exhibits no distension. There is no tenderness. There is no rebound and no guarding.  Musculoskeletal:  Normal range of motion. She exhibits no edema and no tenderness.  Neurological: She is alert and oriented to person, place, and time. Coordination normal.  Skin: Skin is warm and dry. No rash noted. She is not diaphoretic. No erythema. No pallor.  Psychiatric: She has a normal mood and affect. Her behavior is normal. Judgment and thought content normal.      ASSESSMENT AND PLAN

## 2011-06-22 NOTE — Patient Instructions (Signed)
   Echo If the results of your test are normal or stable, you will receive a letter.  If they are abnormal, the nurse will contact you by phone. Your physician wants you to follow up in:  4 months.  You will receive a reminder letter in the mail one-two months in advance.  If you don't receive a letter, please call our office to schedule the follow up appointment

## 2011-06-22 NOTE — Assessment & Plan Note (Addendum)
The patient underwent successful diagnostic and therapeutic pericardial effusion. The etiology of this was inflammatory and not due to malignant effusion. I suspect that it was due to her previous radiation therapy for lung cancer. Hypothyroidism might also have contributed. Adenosine Deaminase level was elevated in the pericardial fluid at 20 but this is still below the cutoff for 40 which is usually associated with tuberculosis.  I recommend a followup echocardiogram which will be scheduled. I will keep her on colchicine for a few months.  She is going to followup with her primary care physician to make sure that her thyroid function is optimal.

## 2011-08-09 ENCOUNTER — Inpatient Hospital Stay (HOSPITAL_COMMUNITY): Payer: Medicare Other

## 2011-08-09 ENCOUNTER — Other Ambulatory Visit: Payer: Self-pay | Admitting: Physician Assistant

## 2011-08-09 ENCOUNTER — Other Ambulatory Visit (INDEPENDENT_AMBULATORY_CARE_PROVIDER_SITE_OTHER): Payer: Medicare Other | Admitting: *Deleted

## 2011-08-09 ENCOUNTER — Ambulatory Visit: Payer: Medicare Other | Admitting: *Deleted

## 2011-08-09 ENCOUNTER — Encounter: Payer: Self-pay | Admitting: Cardiology

## 2011-08-09 ENCOUNTER — Inpatient Hospital Stay (HOSPITAL_COMMUNITY)
Admission: AD | Admit: 2011-08-09 | Discharge: 2011-08-14 | DRG: 238 | Disposition: A | Discharge status: Home / Self Care | Payer: Medicare Other | Source: Ambulatory Visit | Attending: Surgery | Admitting: Surgery

## 2011-08-09 VITALS — BP 146/80 | HR 68

## 2011-08-09 DIAGNOSIS — I498 Other specified cardiac arrhythmias: Secondary | ICD-10-CM | POA: Diagnosis present

## 2011-08-09 DIAGNOSIS — Z85118 Personal history of other malignant neoplasm of bronchus and lung: Secondary | ICD-10-CM

## 2011-08-09 DIAGNOSIS — Z79899 Other long term (current) drug therapy: Secondary | ICD-10-CM

## 2011-08-09 DIAGNOSIS — I318 Other specified diseases of pericardium: Secondary | ICD-10-CM

## 2011-08-09 DIAGNOSIS — I314 Cardiac tamponade: Secondary | ICD-10-CM

## 2011-08-09 DIAGNOSIS — I471 Supraventricular tachycardia: Secondary | ICD-10-CM | POA: Insufficient documentation

## 2011-08-09 DIAGNOSIS — I10 Essential (primary) hypertension: Secondary | ICD-10-CM

## 2011-08-09 DIAGNOSIS — J449 Chronic obstructive pulmonary disease, unspecified: Secondary | ICD-10-CM | POA: Diagnosis present

## 2011-08-09 DIAGNOSIS — I313 Pericardial effusion (noninflammatory): Secondary | ICD-10-CM | POA: Insufficient documentation

## 2011-08-09 DIAGNOSIS — E039 Hypothyroidism, unspecified: Secondary | ICD-10-CM | POA: Diagnosis present

## 2011-08-09 DIAGNOSIS — I319 Disease of pericardium, unspecified: Secondary | ICD-10-CM

## 2011-08-09 DIAGNOSIS — I308 Other forms of acute pericarditis: Secondary | ICD-10-CM

## 2011-08-09 DIAGNOSIS — Z801 Family history of malignant neoplasm of trachea, bronchus and lung: Secondary | ICD-10-CM

## 2011-08-09 DIAGNOSIS — Z888 Allergy status to other drugs, medicaments and biological substances status: Secondary | ICD-10-CM

## 2011-08-09 DIAGNOSIS — Z87891 Personal history of nicotine dependence: Secondary | ICD-10-CM

## 2011-08-09 DIAGNOSIS — K219 Gastro-esophageal reflux disease without esophagitis: Secondary | ICD-10-CM | POA: Diagnosis present

## 2011-08-09 DIAGNOSIS — F411 Generalized anxiety disorder: Secondary | ICD-10-CM | POA: Diagnosis present

## 2011-08-09 DIAGNOSIS — J4489 Other specified chronic obstructive pulmonary disease: Secondary | ICD-10-CM | POA: Diagnosis present

## 2011-08-09 HISTORY — DX: Gastro-esophageal reflux disease without esophagitis: K21.9

## 2011-08-09 HISTORY — DX: Supraventricular tachycardia: I47.1

## 2011-08-09 HISTORY — DX: Chronic obstructive pulmonary disease, unspecified: J44.9

## 2011-08-09 HISTORY — DX: Hypothyroidism, unspecified: E03.9

## 2011-08-09 HISTORY — DX: Supraventricular tachycardia, unspecified: I47.10

## 2011-08-09 HISTORY — DX: Anxiety disorder, unspecified: F41.9

## 2011-08-09 LAB — CBC
HCT: 39.8 % (ref 36.0–46.0)
HCT: 36.9 % (ref 36.0–46.0)
Hemoglobin: 11.8 g/dL — ABNORMAL LOW (ref 12.0–15.0)
MCH: 31.5 pg (ref 26.0–34.0)
MCHC: 32 g/dL (ref 30.0–36.0)
MCV: 98.4 fL (ref 78.0–100.0)
Platelets: 228 10*3/uL (ref 150–400)
Platelets: 212 10*3/uL (ref 150–400)
RBC: 3.75 MIL/uL — ABNORMAL LOW (ref 3.87–5.11)
RDW: 13.2 % (ref 11.5–15.5)
RDW: 13.2 % (ref 11.5–15.5)
WBC: 5 10*3/uL (ref 4.0–10.5)
WBC: 4.3 10*3/uL (ref 4.0–10.5)

## 2011-08-09 LAB — DIFFERENTIAL
Basophils Absolute: 0 10*3/uL (ref 0.0–0.1)
Lymphocytes Relative: 17 % (ref 12–46)
Neutro Abs: 3.8 10*3/uL (ref 1.7–7.7)

## 2011-08-09 LAB — COMPREHENSIVE METABOLIC PANEL
ALT: 10 U/L (ref 0–35)
AST: 14 U/L (ref 0–37)
Albumin: 3.5 g/dL (ref 3.5–5.2)
Alkaline Phosphatase: 59 U/L (ref 39–117)
Alkaline Phosphatase: 52 U/L (ref 39–117)
BUN: 12 mg/dL (ref 6–23)
BUN: 14 mg/dL (ref 6–23)
CO2: 27 mEq/L (ref 19–32)
Calcium: 9.7 mg/dL (ref 8.4–10.5)
Chloride: 105 mEq/L (ref 96–112)
Creatinine, Ser: 0.84 mg/dL (ref 0.50–1.10)
Creatinine, Ser: 0.86 mg/dL (ref 0.50–1.10)
GFR calc Af Amer: 82 mL/min — ABNORMAL LOW (ref >90)
GFR calc Af Amer: 79 mL/min — ABNORMAL LOW (ref >90)
GFR calc non Af Amer: 68 mL/min — ABNORMAL LOW (ref >90)
Glucose, Bld: 87 mg/dL (ref 70–99)
Glucose, Bld: 95 mg/dL (ref 70–99)
Potassium: 3.4 mEq/L — ABNORMAL LOW (ref 3.5–5.1)
Potassium: 2.9 mEq/L — ABNORMAL LOW (ref 3.5–5.1)
Sodium: 142 mEq/L (ref 135–145)
Total Bilirubin: 0.3 mg/dL (ref 0.3–1.2)
Total Bilirubin: 0.2 mg/dL — ABNORMAL LOW (ref 0.3–1.2)
Total Protein: 7.5 g/dL (ref 6.0–8.3)
Total Protein: 6.7 g/dL (ref 6.0–8.3)

## 2011-08-09 LAB — BLOOD GAS, ARTERIAL
Acid-Base Excess: 2.7 mmol/L — ABNORMAL HIGH (ref 0.0–2.0)
Bicarbonate: 26.9 mEq/L — ABNORMAL HIGH (ref 20.0–24.0)
Drawn by: 34779
FIO2: 0.21 %
O2 Saturation: 97.1 %
Patient temperature: 98.6
TCO2: 28.2 mmol/L (ref 0–100)
pCO2 arterial: 43 mmHg (ref 35.0–45.0)
pH, Arterial: 7.412 — ABNORMAL HIGH (ref 7.350–7.400)
pO2, Arterial: 88.6 mmHg (ref 80.0–100.0)

## 2011-08-09 LAB — PROTIME-INR
INR: 1.01 (ref 0.00–1.49)
INR: 1.02 (ref 0.00–1.49)
Prothrombin Time: 13.6 seconds (ref 11.6–15.2)

## 2011-08-09 LAB — TYPE AND SCREEN
ABO/RH(D): A POS
Antibody Screen: NEGATIVE

## 2011-08-09 LAB — APTT
aPTT: 31 seconds (ref 24–37)
aPTT: 31 seconds (ref 24–37)

## 2011-08-09 LAB — TSH: TSH: 2.686 u[IU]/mL (ref 0.350–4.500)

## 2011-08-09 LAB — ABO/RH: ABO/RH(D): A POS

## 2011-08-09 MED ORDER — FENTANYL CITRATE 0.05 MG/ML IJ SOLN: 50.0000 ug | INTRAMUSCULAR | Status: DC | PRN

## 2011-08-09 MED ORDER — LOPERAMIDE HCL 2 MG PO CAPS
2.0000 mg | ORAL_CAPSULE | ORAL | Status: DC | PRN
  Filled 2011-08-09: qty 1

## 2011-08-09 MED ORDER — HYDROCODONE-ACETAMINOPHEN 5-325 MG PO TABS: 1.0000 | ORAL_TABLET | Freq: Four times a day (QID) | ORAL | Status: DC | PRN

## 2011-08-09 MED ORDER — ALPRAZOLAM 0.5 MG PO TABS
0.5000 mg | ORAL_TABLET | Freq: Three times a day (TID) | ORAL | Status: DC | PRN
  Administered 2011-08-11 – 2011-08-14 (×7): 0.5 mg via ORAL
  Filled 2011-08-09 (×7): qty 1

## 2011-08-09 MED ORDER — DEXTROSE 5 % IV SOLN
1.5000 g | INTRAVENOUS | Status: AC
  Administered 2011-08-10 (×2): 1.5 g via INTRAVENOUS
  Filled 2011-08-09: qty 1.5

## 2011-08-09 MED ORDER — ALBUTEROL SULFATE HFA 108 (90 BASE) MCG/ACT IN AERS
1.0000 | INHALATION_SPRAY | RESPIRATORY_TRACT | Status: DC | PRN
  Administered 2011-08-10 (×2): 1 via RESPIRATORY_TRACT
  Filled 2011-08-09 (×2): qty 6.7

## 2011-08-09 MED ORDER — MONTELUKAST SODIUM 10 MG PO TABS
10.0000 mg | ORAL_TABLET | Freq: Every day | ORAL | Status: DC
  Administered 2011-08-09 – 2011-08-13 (×4): 10 mg via ORAL
  Filled 2011-08-09 (×6): qty 1

## 2011-08-09 MED ORDER — COLCHICINE 0.6 MG PO TABS
0.6000 mg | ORAL_TABLET | Freq: Every day | ORAL | Status: DC
  Filled 2011-08-09 (×2): qty 1

## 2011-08-09 MED ORDER — ALUM & MAG HYDROXIDE-SIMETH 200-200-20 MG/5ML PO SUSP: 15.0000 mL | ORAL | Status: DC | PRN

## 2011-08-09 MED ORDER — MAGNESIUM HYDROXIDE 400 MG/5ML PO SUSP: 30.0000 mL | Freq: Every day | ORAL | Status: DC | PRN

## 2011-08-09 MED ORDER — SERTRALINE HCL 50 MG PO TABS
50.0000 mg | ORAL_TABLET | Freq: Every day | ORAL | Status: DC
  Administered 2011-08-11 – 2011-08-14 (×4): 50 mg via ORAL
  Filled 2011-08-09 (×6): qty 1

## 2011-08-09 MED ORDER — TIOTROPIUM BROMIDE MONOHYDRATE 18 MCG IN CAPS
18.0000 ug | ORAL_CAPSULE | Freq: Every day | RESPIRATORY_TRACT | Status: DC
  Administered 2011-08-11 – 2011-08-14 (×4): 18 ug via RESPIRATORY_TRACT
  Filled 2011-08-09: qty 5

## 2011-08-09 MED ORDER — GUAIFENESIN-DM 100-10 MG/5ML PO SYRP: 15.0000 mL | ORAL_SOLUTION | ORAL | Status: DC | PRN

## 2011-08-09 MED ORDER — PANTOPRAZOLE SODIUM 40 MG PO TBEC
40.0000 mg | DELAYED_RELEASE_TABLET | Freq: Every day | ORAL | Status: DC
  Administered 2011-08-11 – 2011-08-12 (×2): 40 mg via ORAL
  Filled 2011-08-09 (×2): qty 1

## 2011-08-09 MED ORDER — MIRTAZAPINE 15 MG PO TABS
15.0000 mg | ORAL_TABLET | Freq: Every day | ORAL | Status: DC
  Administered 2011-08-09 – 2011-08-13 (×5): 15 mg via ORAL
  Filled 2011-08-09 (×6): qty 1

## 2011-08-09 MED ORDER — MIDAZOLAM HCL 2 MG/2ML IJ SOLN: 1.0000 mg | INTRAMUSCULAR | Status: DC | PRN

## 2011-08-09 MED ORDER — SODIUM CHLORIDE 0.9 % IV SOLN: INTRAVENOUS | Status: DC

## 2011-08-09 MED ORDER — LEVOTHYROXINE SODIUM 50 MCG PO TABS
50.0000 ug | ORAL_TABLET | Freq: Every day | ORAL | Status: DC
  Administered 2011-08-11 – 2011-08-14 (×4): 50 ug via ORAL
  Filled 2011-08-09 (×5): qty 1

## 2011-08-09 MED ORDER — LACTATED RINGERS IV SOLN
INTRAVENOUS | Status: DC
  Administered 2011-08-09: 23:00:00 via INTRAVENOUS
  Administered 2011-08-10: 1000 mL via INTRAVENOUS

## 2011-08-09 NOTE — Consult Note (Signed)
[address]            [place] [address]          [phone]       Tina [name] [facility] Medical Record #[id number] Date of Birth: 02-11-1946  Referring: Dr Andee Lineman Primary Care: [facility] AMIN, ALI M., MD Oncology Dr Cleone Slim Chief Complaint:   SOB   History of Present Illness:     [name] transferred by cardiology for pericardial window. Tap done now recurrent pericardial effusion. See history from Dr Earnestine Leys Tina [name] is a 66 year old female with a history of small cell lung cancer in remission after completing chemotherapy almost 2 years ago. She is followed by [facility] [place]. Tina [name] for quite some time has had a large pericardial effusion with concern for malignant effusion. I initially referred Tina [name] earlier last year in [place] for a pericardial window. Unfortunately they felt this was not indicated. Subsequently Tina [name] was followed with a followup echocardiogram which now showed that Tina [name] had pericardial tamponade. I asked Dr. Kirke Corin to see Tina [name] in October of 2012 and Tina [name] suggested we proceeded with pericardiocentesis rather than pericardial window. Successful pericardiocentesis was performed and 950 cc of yellowish colored fluid was removed without complications. Cytology showed no malignant cells. There was evidence of chronic inflammation and Tina [name] was placed on colchicine 0.6 mg by mouth daily which Tina [name] still taking. At that time also Tina [name] was noted that she was hypothyroid with TSH 100. Synthroid therapy was begun in October 2012, and recently Tina [name] received a call from her primary care physician but her TSH was within normal limits.  Tina [name] presented today routinely to [facility] for followup echocardiogram. Tina [name] was noted again that she had a very large pericardial effusion again with him to not physiology with RV collapse and RA inversion. Vital  signs were stable and Tina [name] was not tachycardic. However by echocardiographic parameters Tina effusion was massive which cleared up and not physiology. I told Tina [name] that I could not send her home and that we should proceed with pericardial window. This was actually my initial intention earlier last year particularly because I am still suspicious that Tina [name] may have a malignant effusion, despite negative cytology by pericardiocentesis. Pericardial window will allow [place] to get tissue from Tina pericardium and may be more diagnostic. I asked Tina [name] to call her family and we will refer her to [facility] for admission.  Interestingly, Tina [name] denies any chest pain or shortness of breath both at rest on exertion. She has no orthopnea PND she reports no palpitations or syncope.  Last CT was 6 months ago  Current Activity/ Functional Status: [name] is independent with mobility/ambulation, transfers, ADL's, IADL's.   Past Medical History  Diagnosis Date  . 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 S/P adenoidectomy   . S/P cataract surgery     5x  . Pericardial effusion     Unclear etiology status post pericardiocentesis October 2012 status post removal of 950 cc of yellowish colored fluid, no malignant cells by cytology evidence for chronic inflammation  . Supraventricular tachycardia      developed after pericardiocentesis.  . Hypothyroidism  TSH greater than 100 November 2012 started on replacement therapy normal thyroid function studies December 2012  . Heart murmur   . Asthma   . COPD (chronic obstructive pulmonary disease)   . Shortness of breath   . Anxiety   . Blood transfusion     " NO REACTION TO TRANSFUSION "  . GERD (gastroesophageal reflux disease)     Past Surgical History  Procedure Date  . Fibroid tumors   . Percencentesis     History  Smoking status  . Former Smoker  -- 1.0 packs/day for 30 years  . Types: Cigarettes  . Quit date: 06/06/2008  Smokeless tobacco  . Never Used    History  Alcohol Use No    History   Social History  . Marital Status: Divorced    Spouse Name: N/A    Number of Children: N/A  . Years of Education: N/A   Occupational History  . Not on file.   Social History Main Topics  . Smoking status: Former Smoker -- 1.0 packs/day for 30 years    Types: Cigarettes    Quit date: 06/06/2008  . Smokeless tobacco: Never Used  . Alcohol Use: No  . Drug Use: No  . Sexually Active: No   Other Topics Concern  . Not on file   Social History Narrative   [name] had 2 years of college and did well. 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     Allergies  Allergen Reactions  . Ibuprofen     REACTION: throat swelling    Current Facility-Administered Medications  Medication Dose Route Frequency Provider Last Rate Last Dose  . 0.9 %  sodium chloride infusion   Intravenous Continuous Gene Serpe, PA      . albuterol (PROVENTIL HFA;VENTOLIN HFA) 108 (90 BASE) MCG/ACT inhaler 1 puff  1 puff Inhalation PRN Gene Serpe, PA      . ALPRAZolam (XANAX) tablet 0.5 mg  0.5 mg Oral TID PRN Gene Serpe, PA      . alum & mag hydroxide-simeth (MAALOX/MYLANTA) 200-200-20 MG/5ML suspension 15-30 mL  15-30 mL Oral Q2H PRN Gene Serpe, PA      . colchicine tablet 0.6 mg  0.6 mg Oral Daily Gene Serpe, PA      . guaiFENesin-dextromethorphan (ROBITUSSIN DM) 100-10 MG/5ML syrup 15 mL  15 mL Oral Q4H PRN Gene Serpe, PA      . HYDROcodone-acetaminophen (NORCO) 5-325 MG per tablet 1 tablet  1 tablet Oral Q6H PRN Gene Serpe, PA      . levothyroxine (SYNTHROID, LEVOTHROID) tablet 50 mcg  50 mcg Oral Daily Gene Serpe, PA      . loperamide (IMODIUM) capsule 2 mg  2 mg Oral PRN Gene Serpe, PA      . magnesium hydroxide (MILK OF MAGNESIA) suspension 30 mL  30 mL Oral Daily PRN Gene Serpe, PA      .  mirtazapine (REMERON) tablet 15 mg  15 mg Oral QHS Gene Serpe, PA      . montelukast (SINGULAIR) tablet 10 mg  10 mg Oral Daily Gene Serpe, PA      . pantoprazole (PROTONIX) EC tablet 40 mg  40 mg Oral Q1200 Gene Serpe, PA      . sertraline (ZOLOFT) tablet 50 mg  50 mg Oral Daily Gene Serpe, PA      . tiotropium (SPIRIVA) inhalation capsule 18 mcg  18 mcg Inhalation Daily  Gene Serpe, PA        Prescriptions prior to admission  Medication Sig Dispense Refill  . albuterol (VENTOLIN HFA) 108 (90 BASE) MCG/ACT inhaler Inhale 1 puff into Tina lungs as needed.        . ALPRAZolam (XANAX) 0.5 MG tablet Take 0.5 mg by mouth 3 (three) times daily as needed.       . colchicine 0.6 MG tablet Take 0.6 mg by mouth daily.        Marland Kitchen dexlansoprazole (DEXILANT) 60 MG capsule Take 60 mg by mouth daily as needed.       Marland Kitchen HYDROcodone-acetaminophen (VICODIN) 5-500 MG per tablet Take 1 tablet by mouth every 6 (six) hours as needed.        Marland Kitchen levothyroxine (SYNTHROID, LEVOTHROID) 50 MCG tablet Take 50 mcg by mouth daily.        . mirtazapine (REMERON) 15 MG tablet Take 15 mg by mouth at bedtime.        . montelukast (SINGULAIR) 10 MG tablet Take 10 mg by mouth daily.        . Multiple Vitamins-Minerals (MULTIVITAL) tablet Take 1 tablet by mouth daily.        . sertraline (ZOLOFT) 50 MG tablet Take 50 mg by mouth daily.        Marland Kitchen tiotropium (SPIRIVA HANDIHALER) 18 MCG inhalation capsule Place 18 mcg into inhaler and inhale daily.          Family History  Problem Relation Age of Onset  . Lung cancer Father   . Stomach cancer Paternal Aunt      Review of Systems:     Cardiac Review of Systems: Y or N  Chest Pain [  y  ]  Resting SOB Cove.Etienne   ] Exertional SOB  Cove.Etienne  ]  Orthopnea Cove.Etienne ]   Pedal Edema [ b  ]    Palpitations [ b ] Syncope  [b  ]   Presyncope [n  ]  General Review of Systems: [Y] = yes [  ]=no Constitional: recent weight change [  ]; anorexia [  ]; fatigue [  ]; nausea [  ]; night sweats [  ]; fever [  ];  or chills [  ];                                                                                                                                          Dental: poor dentition[  ];   Eye : blurred vision [  ]; diplopia [   ]; vision changes [  ];  Amaurosis fugax[  ]; Resp: cough Cove.Etienne  ];  wheezing[  ];  hemoptysis[  ]; shortness of breath[y  ]; paroxysmal nocturnal dyspnea[ y ]; dyspnea on exertion[y  ]; or orthopnea[y  ];  GI:  gallstones[  ], vomiting[  ];  dysphagia[  ]; melena[  ];  hematochezia [  ]; heartburn[  ];   Hx of  Colonoscopy[  ]; GU: kidney stones [  ]; hematuria[  ];   dysuria [  ];  nocturia[  ];  history of     obstruction [  ];             Skin: rash, swelling[  ];, hair loss[  ];  peripheral edema[  ];  or itching[  ]; Musculosketetal: myalgias[  ];  joint swelling[  ];  joint erythema[  ];  joint pain[  ];  back pain[  ];  Heme/Lymph: bruising[  ];  bleeding[  ];  anemia[  ];  Neuro: TIA[  ];  headaches[  ];  stroke[  ];  vertigo[  ];  seizures[  ];   paresthesias[  ];  difficulty walking[  ];  Psych:depression[  ]; anxiety[  ];  Endocrine: diabetes[  ];  thyroid dysfunction[  ];  Immunizations: Flu [  ]; Pneumococcal[  ];  Other:  Physical Exam: BP 122/70  Pulse 68  Temp(Src) 97.7 F (36.5 C) (Oral)  Resp 20  Ht 5\' 1"  (1.549 m)  Wt 149 lb 7.6 oz (67.8 kg)  BMI 28.24 kg/m2  SpO2 100%  General appearance: alert, cooperative, no distress and mildly obese Neurologic: intact Heart: distant heart sounds Lungs: clear to auscultation bilaterally Abdomen: soft, non-tender; bowel sounds normal; no masses,  no organomegaly Extremities: extremities normal, atraumatic, no cyanosis or edema, Homans sign is negative, no sign of DVT and no edema, redness or tenderness in Tina calves or thighs   Diagnostic Studies & Laboratory data:     Recent Radiology Findings:   No results found.    Recent Lab Findings: Lab Results  Component Value Date   WBC 5.0 08/09/2011   HGB  12.7 08/09/2011   HCT 39.8 08/09/2011   PLT 228 08/09/2011   GLUCOSE 87 08/09/2011   ALT 11 08/09/2011   AST 14 08/09/2011   NA 141 08/09/2011   K 3.4* 08/09/2011   CL 104 08/09/2011   CREATININE 0.84 08/09/2011   BUN 12 08/09/2011   CO2 31 08/09/2011   INR 1.01 08/09/2011      Assessment / Plan:     Agree with Dr Earnestine Leys. Have recommended pericardial window and drainage for DX and therapeutic reason. Tina goals risks and alternatives of Tina planned surgical procedure Pericardial window  have been discussed with Tina [name] in detail. Tina risks of Tina procedure including death, infection, stroke, myocardial infarction, bleeding, blood transfusion have all been discussed specifically.  I have quoted Prentice Docker a 4% of perioperative mortality and a complication rate as high as 15 %. Tina [name]'s questions have been answered.EUGENIA ELDREDGE is willing  to proceed with Tina planned procedure.Dr Laneta Simmers is available in am to preform surgery.   Will obtain chest XRAY preop  Delight Ovens MD  Beeper [phone] Office [phone] 08/09/2011 6:20 PM

## 2011-08-09 NOTE — H&P (Signed)
Tina Bottoms, MD, [facility] ABIM Board Certified in Adult Cardiovascular Medicine,Internal Medicine and Critical Care Medicine    CC: Recurrent pericardial effusion E causa ignota, status post recent pericardiocentesis  HPI:  The patient is a 66 year old female with a history of small cell lung cancer in remission after completing chemotherapy almost 2 years ago. She is followed by Dr. Isabel Caprice at the cancer Center in [place], [place]. The patient for quite some time has had a large pericardial effusion with concern for malignant effusion. I initially referred the patient earlier last year in [place] for a pericardial window. Unfortunately they felt this was not indicated. Subsequently the patient was followed with a followup echocardiogram which now showed that the patient had pericardial tamponade. I asked Dr. Kirke Corin to see the patient in October of 2012 and he suggested we proceeded with pericardiocentesis rather than pericardial window. Successful pericardiocentesis was performed and 950 cc of yellowish colored fluid was removed without complications. Cytology showed no malignant cells. There was evidence of chronic inflammation and the patient was placed on colchicine 0.6 mg by mouth daily which the patient still taking. At that time also he was noted that she was hypothyroid with TSH 100. Synthroid therapy was begun in October 2012, and recently the patient received a call from her primary care physician but her TSH was within normal limits. The patient presented today routinely to the echo lab for followup echocardiogram. He was noted again that she had a very large pericardial effusion again with him to not physiology with RV collapse and RA inversion. Vital signs were stable and the patient was not tachycardic. However by echocardiographic parameters the effusion was massive which cleared up and not physiology. I told the patient that I could not send her home and that we should proceed with  pericardial window. This was actually my initial intention earlier last year particularly because I am still suspicious that the patient may have a malignant effusion, despite negative cytology by pericardiocentesis. Pericardial window will allow [place] to get tissue from the pericardium and may be more diagnostic. I asked the patient to call her family and we will refer her to [facility] for admission. Interestingly, the patient denies any chest pain or shortness of breath both at rest on exertion. She has no orthopnea PND she reports no palpitations or syncope.  PMH: reviewed and listed in Problem List in Electronic Records (and see below) Past Medical History  Diagnosis Date  . 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 S/P adenoidectomy   . S/P cataract surgery     5x  . Pericardial effusion     Unclear etiology status post pericardiocentesis October 2012 status post removal of 950 cc of yellowish colored fluid, no malignant cells by cytology evidence for chronic inflammation  . Supraventricular tachycardia      developed after pericardiocentesis.  . Hypothyroidism      TSH greater than 100 November 2012 started on replacement therapy normal thyroid function studies December 2012   No past surgical history on file.  Allergies/SH/FHX : available in Electronic Records for review  Allergies  Allergen Reactions  . Ibuprofen     REACTION: throat swelling   History   Social History  . Marital Status: Divorced    Spouse Name: N/A    Number of Children: N/A  . Years of Education: N/A   Occupational History  . Not on  file.   Social History Main Topics  . Smoking status: Former Smoker -- 1.0 packs/day for 30 years    Types: Cigarettes    Quit date: 06/06/2008  . Smokeless tobacco: Never Used  . Alcohol Use: No  . Drug Use: No  . Sexually Active: Not on file   Other Topics Concern  . Not on file   Social  History Narrative   Patient had 2 years of college and did well. 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    Family History  Problem Relation Age of Onset  . Lung cancer Father   . Stomach cancer Paternal Aunt     Medications: No current facility-administered medications for this encounter.   Current Outpatient Prescriptions  Medication Sig Dispense Refill  . albuterol (VENTOLIN HFA) 108 (90 BASE) MCG/ACT inhaler Inhale 1 puff into the lungs as needed.        . ALPRAZolam (XANAX) 0.5 MG tablet Take 0.5 mg by mouth 3 (three) times daily as needed.       . colchicine 0.6 MG tablet Take 0.6 mg by mouth daily.        Marland Kitchen dexlansoprazole (DEXILANT) 60 MG capsule Take 60 mg by mouth daily as needed.       Marland Kitchen HYDROcodone-acetaminophen (VICODIN) 5-500 MG per tablet Take 1 tablet by mouth every 6 (six) hours as needed.        Marland Kitchen levothyroxine (SYNTHROID, LEVOTHROID) 50 MCG tablet Take 50 mcg by mouth daily.        . mirtazapine (REMERON) 15 MG tablet Take 15 mg by mouth at bedtime.        . montelukast (SINGULAIR) 10 MG tablet Take 10 mg by mouth daily.        . Multiple Vitamins-Minerals (MULTIVITAL) tablet Take 1 tablet by mouth daily.        . sertraline (ZOLOFT) 50 MG tablet Take 50 mg by mouth daily.        Marland Kitchen tiotropium (SPIRIVA HANDIHALER) 18 MCG inhalation capsule Place 18 mcg into inhaler and inhale daily.          ROS: No nausea or vomiting. No fever or chills.No melena or hematochezia.No bleeding.No claudication. No cough. No dizziness or weakness.  Physical Exam: BP 146/70 HR 64 beats per minute respiratory rate 18 breaths per minute and unlabored No physical examination was performed  12lead ECG: Not performed Echocardiogram results available in a separate report in epic. In essence there is a very large circumferential free-flowing pericardial effusion with tamponade physiology. LV function is normal there is  compression of the right ventricle.  Counseling was provided regarding the current medical condition and included: . Diagnosis, impressions, prognosis, recommended diagnostic studies  . Risks and benefits of treatment options  . Instructions for management, treatment and/or follow-up care  . Importance of compliance with treatment, risk factor reduction  . Patient and/or family education    Time spent counseling was 45 minutes and recorded in the Problem List.   Patient Active Problem List  Diagnoses  . HYPERTENSION  . PERICARDIAL EFFUSION  . ESOPHAGEAL REFLUX  . PERS HX TOBACCO USE PRESENTING HAZARDS HEALTH  . ANXIETY DISORDER  . Lung cancer-small cell lung cancer status post chemotherapy and radiation therapy in remission   . Hypothyroidism-apparently normal TSH recently   . Supraventricular tachycardia-single occurrence post pericardiocentesis   . Pericardial effusion-recurrent after previous pericardiocentesis, etiology unclear felt to be  chronic inflammation on colchicine, recurrent despite correction of TSH     PLAN   Had a long discussion with the patient today. I explained to her that after review of the echocardiogram she again is tamponade physiology. This despite pericardiocentesis 2 months ago.  Etiology of the pericardial effusion was felt to be secondary to chronic inflammation and the patient was started on colchicine. TSH was also very abnormal and very elevated and the patient is hypothyroid but the effusion has recurred even with normalized TSH.  No malignant cells were found during the last pericardiocentesis and the fluid was nonhemorrhagic.  I discussed the case with Dr. Tyrone Sage and we both agreed that the patient is to proceed with pericardial window. Pericardial biopsy can be obtained in may be more diagnostic if the patient could have a malignant effusion.  The patient will be admitted and IV fluids will be started, colchicine will be continued, will check  correlation parameters and keep patient n.p.o. after midnight and temporally scheduled for pericardial centesis tomorrow with Dr. Tyrone Sage.

## 2011-08-10 ENCOUNTER — Inpatient Hospital Stay (HOSPITAL_COMMUNITY): Payer: Medicare Other

## 2011-08-10 ENCOUNTER — Inpatient Hospital Stay (HOSPITAL_COMMUNITY): Payer: Medicare Other | Admitting: Anesthesiology

## 2011-08-10 ENCOUNTER — Encounter (HOSPITAL_COMMUNITY): Payer: Self-pay | Admitting: Anesthesiology

## 2011-08-10 ENCOUNTER — Other Ambulatory Visit: Payer: Self-pay | Admitting: Surgery

## 2011-08-10 ENCOUNTER — Encounter (HOSPITAL_COMMUNITY)
Admission: AD | Disposition: A | Discharge status: Home / Self Care | Payer: Self-pay | Source: Ambulatory Visit | Attending: Surgery

## 2011-08-10 ENCOUNTER — Ambulatory Visit (HOSPITAL_COMMUNITY): Admission: RE | Admit: 2011-08-10 | Payer: Medicare Other | Source: Ambulatory Visit | Admitting: Cardiothoracic Surgery

## 2011-08-10 ENCOUNTER — Other Ambulatory Visit: Payer: Self-pay

## 2011-08-10 DIAGNOSIS — I314 Cardiac tamponade: Secondary | ICD-10-CM

## 2011-08-10 HISTORY — PX: SUBXYPHOID PERICARDIAL WINDOW: SHX5075

## 2011-08-10 LAB — URINE MICROSCOPIC-ADD ON

## 2011-08-10 LAB — URINALYSIS, ROUTINE W REFLEX MICROSCOPIC
Bilirubin Urine: NEGATIVE
Glucose, UA: NEGATIVE mg/dL
Hgb urine dipstick: NEGATIVE
Ketones, ur: NEGATIVE mg/dL
Nitrite: NEGATIVE
Protein, ur: NEGATIVE mg/dL
Specific Gravity, Urine: 1.012 (ref 1.005–1.030)
Urobilinogen, UA: 0.2 mg/dL (ref 0.0–1.0)
pH: 6 (ref 5.0–8.0)

## 2011-08-10 LAB — GLUCOSE, CAPILLARY: Glucose-Capillary: 103 mg/dL — ABNORMAL HIGH (ref 70–99)

## 2011-08-10 SURGERY — CREATION, PERICARDIAL WINDOW, SUBXIPHOID APPROACH
Anesthesia: General | Site: Chest | Wound class: Clean

## 2011-08-10 MED ORDER — HYDROMORPHONE HCL PF 1 MG/ML IJ SOLN
0.2500 mg | INTRAMUSCULAR | Status: DC | PRN
  Administered 2011-08-10 (×5): 0.5 mg via INTRAVENOUS

## 2011-08-10 MED ORDER — MORPHINE SULFATE 2 MG/ML IJ SOLN
2.0000 mg | INTRAMUSCULAR | Status: DC | PRN
  Administered 2011-08-10: 2 mg via INTRAVENOUS
  Filled 2011-08-10: qty 1

## 2011-08-10 MED ORDER — MEPERIDINE HCL 25 MG/ML IJ SOLN
6.2500 mg | INTRAMUSCULAR | Status: DC | PRN
Start: 2011-08-10 — End: 2011-08-10

## 2011-08-10 MED ORDER — ROCURONIUM BROMIDE 100 MG/10ML IV SOLN
INTRAVENOUS | Status: DC | PRN
  Administered 2011-08-10: 35 mg via INTRAVENOUS

## 2011-08-10 MED ORDER — OXYCODONE-ACETAMINOPHEN 5-325 MG PO TABS
1.0000 | ORAL_TABLET | ORAL | Status: DC | PRN
  Administered 2011-08-11 – 2011-08-12 (×3): 1 via ORAL
  Filled 2011-08-10 (×3): qty 1

## 2011-08-10 MED ORDER — POTASSIUM CHLORIDE 10 MEQ/50ML IV SOLN
10.0000 meq | Freq: Every day | INTRAVENOUS | Status: DC | PRN
  Administered 2011-08-11: 10 meq via INTRAVENOUS

## 2011-08-10 MED ORDER — FENTANYL CITRATE 0.05 MG/ML IJ SOLN
INTRAMUSCULAR | Status: DC | PRN
  Administered 2011-08-10: 100 ug via INTRAVENOUS
  Administered 2011-08-10: 50 ug via INTRAVENOUS
  Administered 2011-08-10: 100 ug via INTRAVENOUS

## 2011-08-10 MED ORDER — GLYCOPYRROLATE 0.2 MG/ML IJ SOLN
INTRAMUSCULAR | Status: DC | PRN
  Administered 2011-08-10: .6 mg via INTRAVENOUS

## 2011-08-10 MED ORDER — ALBUTEROL SULFATE HFA 108 (90 BASE) MCG/ACT IN AERS
2.0000 | INHALATION_SPRAY | Freq: Four times a day (QID) | RESPIRATORY_TRACT | Status: DC | PRN
  Administered 2011-08-11 – 2011-08-14 (×6): 2 via RESPIRATORY_TRACT

## 2011-08-10 MED ORDER — NEOSTIGMINE METHYLSULFATE 1 MG/ML IJ SOLN
INTRAMUSCULAR | Status: DC | PRN
  Administered 2011-08-10: 3 mg via INTRAVENOUS

## 2011-08-10 MED ORDER — MIDAZOLAM HCL 5 MG/5ML IJ SOLN
INTRAMUSCULAR | Status: DC | PRN
  Administered 2011-08-10: 2 mg via INTRAVENOUS

## 2011-08-10 MED ORDER — KCL IN DEXTROSE-NACL 20-5-0.45 MEQ/L-%-% IV SOLN
INTRAVENOUS | Status: DC
  Administered 2011-08-10: 20:00:00 via INTRAVENOUS
  Filled 2011-08-10 (×2): qty 1000

## 2011-08-10 MED ORDER — TRAMADOL HCL 50 MG PO TABS
50.0000 mg | ORAL_TABLET | Freq: Four times a day (QID) | ORAL | Status: DC | PRN
  Administered 2011-08-11: 100 mg via ORAL
  Filled 2011-08-10: qty 2

## 2011-08-10 MED ORDER — CEFUROXIME SODIUM 1.5 G IJ SOLR
1.5000 g | Freq: Two times a day (BID) | INTRAMUSCULAR | Status: AC
  Administered 2011-08-10 – 2011-08-11 (×2): 1.5 g via INTRAVENOUS
  Filled 2011-08-10 (×2): qty 1.5

## 2011-08-10 MED ORDER — BISACODYL 5 MG PO TBEC
10.0000 mg | DELAYED_RELEASE_TABLET | Freq: Every day | ORAL | Status: DC
  Administered 2011-08-10 – 2011-08-11 (×2): 10 mg via ORAL
  Filled 2011-08-10 (×2): qty 1
  Filled 2011-08-10: qty 2

## 2011-08-10 MED ORDER — NICOTINE POLACRILEX 2 MG MT GUM
2.0000 mg | CHEWING_GUM | OROMUCOSAL | Status: DC | PRN
  Filled 2011-08-10: qty 1

## 2011-08-10 MED ORDER — LACTATED RINGERS IV SOLN
INTRAVENOUS | Status: DC | PRN
  Administered 2011-08-10: 08:00:00 via INTRAVENOUS

## 2011-08-10 MED ORDER — OXYCODONE HCL 5 MG PO TABS
5.0000 mg | ORAL_TABLET | ORAL | Status: AC | PRN
  Administered 2011-08-11 (×3): 5 mg via ORAL
  Filled 2011-08-10: qty 2
  Filled 2011-08-10 (×3): qty 1

## 2011-08-10 MED ORDER — SENNOSIDES-DOCUSATE SODIUM 8.6-50 MG PO TABS
1.0000 | ORAL_TABLET | Freq: Every evening | ORAL | Status: DC | PRN
  Filled 2011-08-10: qty 1

## 2011-08-10 MED ORDER — ONDANSETRON HCL 4 MG/2ML IJ SOLN
INTRAMUSCULAR | Status: DC | PRN
  Administered 2011-08-10: 4 mg via INTRAVENOUS

## 2011-08-10 MED ORDER — ETOMIDATE 2 MG/ML IV SOLN
INTRAVENOUS | Status: DC | PRN
  Administered 2011-08-10: 20 mg via INTRAVENOUS

## 2011-08-10 MED ORDER — 0.9 % SODIUM CHLORIDE (POUR BTL) OPTIME
TOPICAL | Status: DC | PRN
  Administered 2011-08-10: 1000 mL

## 2011-08-10 MED ORDER — PROMETHAZINE HCL 25 MG/ML IJ SOLN
6.2500 mg | INTRAMUSCULAR | Status: DC | PRN
  Administered 2011-08-10: 6.25 mg via INTRAVENOUS

## 2011-08-10 MED ORDER — ONDANSETRON HCL 4 MG/2ML IJ SOLN
4.0000 mg | Freq: Four times a day (QID) | INTRAMUSCULAR | Status: DC | PRN
  Administered 2011-08-11: 4 mg via INTRAVENOUS
  Filled 2011-08-10: qty 2

## 2011-08-10 MED ORDER — ACETAMINOPHEN 10 MG/ML IV SOLN
1000.0000 mg | Freq: Four times a day (QID) | INTRAVENOUS | Status: AC
  Administered 2011-08-10 – 2011-08-11 (×4): 1000 mg via INTRAVENOUS
  Filled 2011-08-10 (×4): qty 100

## 2011-08-10 MED ORDER — EPHEDRINE SULFATE 50 MG/ML IJ SOLN
INTRAMUSCULAR | Status: DC | PRN
  Administered 2011-08-10: 5 mg via INTRAVENOUS

## 2011-08-10 SURGICAL SUPPLY — 47 items
ATTRACTOMAT 16X20 MAGNETIC DRP (DRAPES) ×2 IMPLANT
CANISTER SUCTION 2500CC (MISCELLANEOUS) ×2 IMPLANT
CATH THORACIC 28FR (CATHETERS) IMPLANT
CATH THORACIC 28FR RT ANG (CATHETERS) IMPLANT
CATH THORACIC 36FR (CATHETERS) IMPLANT
CATH THORACIC 36FR RT ANG (CATHETERS) ×2 IMPLANT
CLIP TI MEDIUM 6 (CLIP) ×2 IMPLANT
CLIP TI WIDE RED SMALL 6 (CLIP) ×2 IMPLANT
CLOTH BEACON ORANGE TIMEOUT ST (SAFETY) ×2 IMPLANT
CONT SPEC 4OZ CLIKSEAL STRL BL (MISCELLANEOUS) ×2 IMPLANT
CONT SPECI 4OZ STER CLIK (MISCELLANEOUS) ×4 IMPLANT
COVER SURGICAL LIGHT HANDLE (MISCELLANEOUS) ×4 IMPLANT
DRAIN CHANNEL 28F RND 3/8 FF (WOUND CARE) IMPLANT
DRAPE LAPAROSCOPIC ABDOMINAL (DRAPES) ×2 IMPLANT
ELECT REM PT RETURN 9FT ADLT (ELECTROSURGICAL) ×2
ELECTRODE REM PT RTRN 9FT ADLT (ELECTROSURGICAL) ×1 IMPLANT
GAUZE SPONGE 4X4 12PLY STRL LF (GAUZE/BANDAGES/DRESSINGS) ×4 IMPLANT
GLOVE BIO SURGEON STRL SZ 6 (GLOVE) ×2 IMPLANT
GLOVE BIOGEL PI IND STRL 6.5 (GLOVE) ×3 IMPLANT
GLOVE BIOGEL PI INDICATOR 6.5 (GLOVE) ×3
GLOVE EUDERMIC 7 POWDERFREE (GLOVE) ×4 IMPLANT
GOWN PREVENTION PLUS XLARGE (GOWN DISPOSABLE) ×2 IMPLANT
HEMOSTAT POWDER SURGIFOAM 1G (HEMOSTASIS) IMPLANT
KIT BASIN OR (CUSTOM PROCEDURE TRAY) ×2 IMPLANT
KIT ROOM TURNOVER OR (KITS) ×2 IMPLANT
NS IRRIG 1000ML POUR BTL (IV SOLUTION) ×4 IMPLANT
PACK CHEST (CUSTOM PROCEDURE TRAY) ×2 IMPLANT
PAD ARMBOARD 7.5X6 YLW CONV (MISCELLANEOUS) ×4 IMPLANT
PAD ELECT DEFIB RADIOL ZOLL (MISCELLANEOUS) ×2 IMPLANT
SPONGE GAUZE 4X4 12PLY (GAUZE/BANDAGES/DRESSINGS) ×2 IMPLANT
SUT SILK  1 MH (SUTURE) ×1
SUT SILK 1 MH (SUTURE) ×1 IMPLANT
SUT VIC AB 1 CTX 18 (SUTURE) IMPLANT
SUT VIC AB 2-0 CT1 27 (SUTURE)
SUT VIC AB 2-0 CT1 TAPERPNT 27 (SUTURE) IMPLANT
SUT VIC AB 3-0 X1 27 (SUTURE) IMPLANT
SWAB COLLECTION DEVICE MRSA (MISCELLANEOUS) IMPLANT
SYR 50ML SLIP (SYRINGE) IMPLANT
SYRINGE 10CC LL (SYRINGE) IMPLANT
SYSTEM SAHARA CHEST DRAIN ATS (WOUND CARE) ×2 IMPLANT
TAPE CLOTH SURG 4X10 WHT LF (GAUZE/BANDAGES/DRESSINGS) ×4 IMPLANT
TOWEL OR 17X24 6PK STRL BLUE (TOWEL DISPOSABLE) ×6 IMPLANT
TOWEL OR 17X26 10 PK STRL BLUE (TOWEL DISPOSABLE) ×2 IMPLANT
TRAP SPECIMEN MUCOUS 40CC (MISCELLANEOUS) ×2 IMPLANT
TRAY FOLEY CATH 14FRSI W/METER (CATHETERS) ×2 IMPLANT
TUBE ANAEROBIC SPECIMEN COL (MISCELLANEOUS) IMPLANT
WATER STERILE IRR 1000ML POUR (IV SOLUTION) ×4 IMPLANT

## 2011-08-10 NOTE — Op Note (Signed)
NAME:  Tina, [name]                     ACCOUNT NO.:  MEDICAL RECORD NO.:  [id number]  LOCATION:                                 FACILITY:  PHYSICIAN:  Evelene Croon, M.D.     DATE OF BIRTH:  [birth year]  DATE OF PROCEDURE:  08/10/2011 DATE OF DISCHARGE:                              OPERATIVE REPORT   PREOPERATIVE DIAGNOSIS:  Large pericardial effusion.  POSTOPERATIVE DIAGNOSIS:  Large pericardial effusion.  PROCEDURE:  Subxiphoid pericardial window.  SURGEON:  Evelene Croon, M.D.  ASSISTANT:  None.  ANESTHESIA:  General endotracheal.  CLINICAL HISTORY:  This patient is a 66 year old woman with a history of small-cell lung cancer that was felt to be in remission after completing chemotherapy almost 2 years ago.  She has been followed by Dr. Isabel Caprice at the [facility] in [facility].  The patient has apparently been followed with a large pericardial effusion for some time with a question of whether this was malignant.  She subsequently underwent a pericardiocentesis in October 2012, draining 950 mL of serous yellow fluid.  Cytology was negative for malignancy.  It was felt that this was most likely chronic inflammation.  The patient was treated with colchicine daily.  She recently had an echocardiogram for followup which showed recurrence of a large pericardial effusion with some concern about signs of tamponade, although the patient was essentially asymptomatic.  She was transferred to [facility] for pericardial window. The operative procedure was discussed with the patient including alternatives, benefits, and risks including, but not limited to bleeding, blood transfusion, injury to the heart, infection, pneumothorax, and recurrence of the pericardial effusion.  She understood and agreed to proceed.  OPERATIVE PROCEDURE:  The patient was seen in the preoperative holding area and the proper patient, proper operation were confirmed with the patient and the  chart.  The consent was signed.  She was taken back to the operating room.  Preoperative intravenous antibiotics were given. Then, after induction of general endotracheal anesthesia, a Foley catheter was placed in bladder using sterile technique.  Lower extremity pneumatic compression devices were used.  Then, the neck, chest, and abdomen were prepped with Betadine soap and solution, draped in usual sterile manner.  A vertical midline incision was made centered over the xiphoid process and slightly extending into the epigastrium.  The total incision was about 5 cm long.  This subcutaneous tissue was then divided using electrocautery down to the midline fascia which was also divided using electrocautery.  The subxiphoid space was entered and the pericardium was immediately visible.  A small opening was made in the pericardium and 750 mL of bloody pericardial fluid was immediately drained, some of this was sent for cytology.  There was no change in hemodynamics with complete drainage of the effusion.  A piece of pericardium was excised anteriorly in a circular fashion measuring about 4 cm in diameter.  This was sent to pathology for examination.  This created a good size window in the pericardium.  Then, a 36-French right- angle chest tube was brought through a separate stab incision and positioned in the posterior pericardium.  It was fixed  to the skin with silk suture.  There was complete hemostasis.  Then, the midline fascia was reapproximated with interrupted #1 Vicryl sutures.  Subcutaneous tissue was closed with continuous 2-0 Vicryl, and the skin with a 3-0 Vicryl subcuticular skin closure.  The sponge, needle, instrument counts were correct according to scrub nurse.  Dry sterile dressing applied over the incision around the chest tube which was hooked to Pleur-Evac suction.  The patient was then awakened, extubated, and transferred to the Post Anesthesia Care Unit in satisfactory and  stable condition.     Evelene Croon, M.D.     BB/MEDQ  D:  08/10/2011  T:  08/10/2011  Job:  [id number]  cc:   Lynett Fish, M.D.

## 2011-08-10 NOTE — Preoperative (Signed)
Beta Blockers   Reason not to administer Beta Blockers:Not Applicable 

## 2011-08-10 NOTE — OR Nursing (Signed)
 of pericardial fluid drained by Dr. Laneta Simmers during surgical procedure 08/10/2011.

## 2011-08-10 NOTE — Transfer of Care (Signed)
Immediate Anesthesia Transfer of Care Note  Patient: Tina [name]  Procedure(s) Performed:  SUBXYPHOID PERICARDIAL WINDOW - Subxyphiod Pericardial Window for Drainage of Pericadial Fluid and Chest Tube Placement  Patient Location: PACU  Anesthesia Type: General  Level of Consciousness: awake, alert  and oriented  Airway & Oxygen Therapy: Patient Spontanous Breathing and Patient connected to nasal cannula oxygen  Post-op Assessment: Report given to PACU RN and Post -op Vital signs reviewed and stable  Post vital signs: Reviewed and stable  Complications: No apparent anesthesia complications

## 2011-08-10 NOTE — Brief Op Note (Signed)
08/09/2011 - 08/10/2011  9:19 AM  PATIENT:  Tina [name]  66 y.o. female  PRE-OPERATIVE DIAGNOSIS:  pericardial tamponade  POST-OPERATIVE DIAGNOSIS:  Pericardial Tamponade  PROCEDURE:  Procedure(s): SUBXYPHOID PERICARDIAL WINDOW  SURGEON:  Surgeon(s): [name], MD  PHYSICIAN ASSISTANT:     ANESTHESIA:   general  Pericardial fluid for cytology Pericardium for pathology  750cc bloody pericardial fluid. No change in hemodynamics with drainage.

## 2011-08-10 NOTE — Anesthesia Procedure Notes (Signed)
Procedure Name: Intubation Date/Time: 08/10/2011 7:45 AM Performed by: Delbert Harness Pre-anesthesia Checklist: Patient identified, Emergency Drugs available, Suction available, Patient being monitored and Timeout performed Patient Re-evaluated:Patient Re-evaluated prior to inductionOxygen Delivery Method: Circle System Utilized Preoxygenation: Pre-oxygenation with 100% oxygen Intubation Type: IV induction Ventilation: Mask ventilation without difficulty Laryngoscope Size: Mac and 3 Grade View: Grade II Tube type: Oral Tube size: 7.5 mm Number of attempts: 1 Placement Confirmation: ETT inserted through vocal cords under direct vision,  positive ETCO2 and breath sounds checked- equal and bilateral Secured at: 20 cm Tube secured with: Tape Dental Injury: Teeth and Oropharynx as per pre-operative assessment

## 2011-08-10 NOTE — Anesthesia Preprocedure Evaluation (Addendum)
Anesthesia Evaluation  Patient identified by MRN, date of birth, ID band Patient awake    Reviewed: Allergy & Precautions, H&P , NPO status , Patient's Chart, lab work & pertinent test results  Airway Mallampati: II  Neck ROM: Full  Mouth opening: Limited Mouth Opening  Dental  (+) Teeth Intact and Chipped   Pulmonary shortness of breath, asthma , COPDformer smoker Small cell lung cancer s/p chemo clear to auscultation        Cardiovascular + dysrhythmias + Valvular Problems/Murmurs Regular Normal EF 55-65% , recurring pericardial effusion VSS, patient states breathing isn't labored. She does not report any significant symptoms from effusion   Neuro/Psych PSYCHIATRIC DISORDERS Anxiety    GI/Hepatic GERD-  Medicated and Controlled,  Endo/Other  Hypothyroidism   Renal/GU negative Renal ROS     Musculoskeletal   Abdominal (+) obese,   Peds  Hematology   Anesthesia Other Findings   Reproductive/Obstetrics                        Anesthesia Physical Anesthesia Plan  ASA: III  Anesthesia Plan: General   Post-op Pain Management:    Induction: Intravenous  Airway Management Planned: Oral ETT  Additional Equipment: Arterial line and CVP  Intra-op Plan:   Post-operative Plan: Extubation in OR  Informed Consent: I have reviewed the patients History and Physical, chart, labs and discussed the procedure including the risks, benefits and alternatives for the proposed anesthesia with the patient or authorized representative who has indicated his/her understanding and acceptance.   Dental advisory given  Plan Discussed with: CRNA and Surgeon  Anesthesia Plan Comments:         Anesthesia Quick Evaluation

## 2011-08-10 NOTE — Anesthesia Postprocedure Evaluation (Signed)
  Anesthesia Post-op Note  Patient: Tina [name]  Procedure(s) Performed:  SUBXYPHOID PERICARDIAL WINDOW - Subxyphiod Pericardial Window for Drainage of Pericadial Fluid and Chest Tube Placement  Patient Location: PACU  Anesthesia Type: General  Level of Consciousness: awake  Airway and Oxygen Therapy: Patient Spontanous Breathing  Post-op Pain: mild  Post-op Assessment: Post-op Vital signs reviewed  Post-op Vital Signs: stable  Complications: No apparent anesthesia complications

## 2011-08-11 ENCOUNTER — Inpatient Hospital Stay (HOSPITAL_COMMUNITY): Payer: Medicare Other

## 2011-08-11 LAB — CBC
HCT: 36.8 % (ref 36.0–46.0)
MCHC: 32.3 g/dL (ref 30.0–36.0)
MCV: 97.6 fL (ref 78.0–100.0)
RDW: 13.3 % (ref 11.5–15.5)
WBC: 6.7 10*3/uL (ref 4.0–10.5)

## 2011-08-11 LAB — BASIC METABOLIC PANEL
BUN: 10 mg/dL (ref 6–23)
Chloride: 105 mEq/L (ref 96–112)
Creatinine, Ser: 0.73 mg/dL (ref 0.50–1.10)
GFR calc Af Amer: >90 mL/min (ref >90)

## 2011-08-11 MED ORDER — POTASSIUM CHLORIDE 10 MEQ/50ML IV SOLN
10.0000 meq | INTRAVENOUS | Status: DC | PRN
  Administered 2011-08-11 (×2): 10 meq via INTRAVENOUS

## 2011-08-11 MED ORDER — POTASSIUM CHLORIDE 10 MEQ/50ML IV SOLN
INTRAVENOUS | Status: AC
  Administered 2011-08-11: 10 meq via INTRAVENOUS
  Filled 2011-08-11: qty 150

## 2011-08-11 MED ORDER — ALUM & MAG HYDROXIDE-SIMETH 200-200-20 MG/5ML PO SUSP
30.0000 mL | Freq: Four times a day (QID) | ORAL | Status: DC | PRN
  Administered 2011-08-11 – 2011-08-12 (×2): 30 mL via ORAL
  Filled 2011-08-11 (×2): qty 30

## 2011-08-11 NOTE — Progress Notes (Signed)
Went into room to assess why a-line was not picking up. Found patient with arm up by her face, with site leaking bloody drainage. Applied pressure, removed dressing, and attempted to redress. Fast- flushed to make sure site not leaking; leaking continued. D/c site. Held pressure. Applied dressing.

## 2011-08-11 NOTE — Progress Notes (Signed)
1 Day Post-Op Procedure(s) (LRB): SUBXYPHOID PERICARDIAL WINDOW (N/A) Subjective:  No complaints  Objective: Vital signs in last 24 hours: Temp:  [97.6 F (36.4 C)-99.2 F (37.3 C)] 98.4 F (36.9 C) (01/05 0727) Pulse Rate:  [71-98] 71  (01/05 1100) Cardiac Rhythm:  [-] Normal sinus rhythm (01/05 0800) Resp:  [9-27] 14  (01/05 1100) BP: (84-125)/(44-109) 87/52 mmHg (01/05 1100) SpO2:  [92 %-100 %] 97 % (01/05 1100) Arterial Line BP: (88-162)/(43-70) 93/61 mmHg (01/05 0400) Weight:  [68.4 kg (150 lb 12.7 oz)] 150 lb 12.7 oz (68.4 kg) (01/05 0600)  Hemodynamic parameters for last 24 hours:    Intake/Output from previous day: 01/04 0701 - 01/05 0700 In: 3464.2 [P.O.:170; I.V.:2894.2; IV Piggyback:400] Out: 2695 [Urine:1635; Blood:10; Chest Tube:350] Intake/Output this shift: Total I/O In: 440 [P.O.:290; IV Piggyback:150] Out: 290 [Urine:290]  General appearance: alert and cooperative Neurologic: intact Heart: regular rate and rhythm, S1, S2 normal, no murmur, click, rub or gallop Lungs: clear to auscultation bilaterally Extremities: extremities normal, atraumatic, no cyanosis or edema Wound: dressing dry  Lab Results:  Basename 08/11/11 0400 08/09/11 2117  WBC 6.7 4.3  HGB 11.9* 11.8*  HCT 36.8 36.9  PLT 175 212   BMET:  Basename 08/11/11 0400 08/09/11 2117  NA 138 142  K 3.4* 2.9*  CL 105 105  CO2 27 27  GLUCOSE 121* 95  BUN 10 14  CREATININE 0.73 0.86  CALCIUM 8.8 9.7    PT/INR:  Basename 08/09/11 2117  LABPROT 13.6  INR 1.02   ABG    Component Value Date/Time   PHART 7.412* 08/09/2011 1930   HCO3 26.9* 08/09/2011 1930   TCO2 28.2 08/09/2011 1930   O2SAT 97.1 08/09/2011 1930   CBG (last 3)   Basename 08/10/11 0551  GLUCAP 103*    CXR:  Left basilar atelectasis  Assessment/Plan: S/P Procedure(s) (LRB): SUBXYPHOID PERICARDIAL WINDOW (N/A) Ct to water seal.  Can probably remove in am. Mobilize, IS Follow up on pathology monday   LOS: 2 days     Milly Goggins K 08/11/2011

## 2011-08-11 NOTE — Progress Notes (Signed)
Patient ID: Tina [name], female   DOB: [birth year], 66 y.o.   MRN: [id number] Filed Vitals:   08/11/11 2000 08/11/11 2023 08/11/11 2100 08/11/11 2120  BP: 109/59  107/51   Pulse: 77  79 80  Temp:      TempSrc:      Resp: 14  18 14   Height:      Weight:      SpO2: 97% 98% 95% 94%    Stable day.

## 2011-08-12 LAB — BASIC METABOLIC PANEL
BUN: 11 mg/dL (ref 6–23)
Chloride: 104 mEq/L (ref 96–112)
Glucose, Bld: 117 mg/dL — ABNORMAL HIGH (ref 70–99)
Potassium: 4 mEq/L (ref 3.5–5.1)

## 2011-08-12 MED ORDER — SODIUM CHLORIDE 0.9 % IJ SOLN
INTRAMUSCULAR | Status: AC
  Filled 2011-08-12: qty 10

## 2011-08-12 MED ORDER — TRAMADOL HCL 50 MG PO TABS: 50.0000 mg | ORAL_TABLET | ORAL | Status: DC | PRN

## 2011-08-12 MED ORDER — MOVING RIGHT ALONG BOOK
Freq: Once | Status: DC
  Filled 2011-08-12: qty 1

## 2011-08-12 MED ORDER — ONDANSETRON HCL 4 MG/2ML IJ SOLN: 4.0000 mg | Freq: Four times a day (QID) | INTRAMUSCULAR | Status: DC | PRN

## 2011-08-12 MED ORDER — ALUM & MAG HYDROXIDE-SIMETH 200-200-20 MG/5ML PO SUSP: 15.0000 mL | ORAL | Status: DC | PRN

## 2011-08-12 MED ORDER — PANTOPRAZOLE SODIUM 40 MG PO TBEC
40.0000 mg | DELAYED_RELEASE_TABLET | Freq: Every day | ORAL | Status: DC
  Administered 2011-08-13 – 2011-08-14 (×2): 40 mg via ORAL
  Filled 2011-08-12 (×2): qty 1

## 2011-08-12 MED ORDER — SODIUM CHLORIDE 0.9 % IV SOLN: 250.0000 mL | INTRAVENOUS | Status: DC | PRN

## 2011-08-12 MED ORDER — SODIUM CHLORIDE 0.9 % IJ SOLN: 3.0000 mL | INTRAMUSCULAR | Status: DC | PRN

## 2011-08-12 MED ORDER — SODIUM CHLORIDE 0.9 % IJ SOLN
3.0000 mL | Freq: Two times a day (BID) | INTRAMUSCULAR | Status: DC
  Administered 2011-08-12 – 2011-08-14 (×4): 3 mL via INTRAVENOUS

## 2011-08-12 MED ORDER — ACETAMINOPHEN 325 MG PO TABS: 650.0000 mg | ORAL_TABLET | Freq: Four times a day (QID) | ORAL | Status: DC | PRN

## 2011-08-12 MED ORDER — ONDANSETRON HCL 4 MG PO TABS: 4.0000 mg | ORAL_TABLET | Freq: Four times a day (QID) | ORAL | Status: DC | PRN

## 2011-08-12 MED ORDER — OXYCODONE HCL 5 MG PO TABS
5.0000 mg | ORAL_TABLET | ORAL | Status: DC | PRN
  Administered 2011-08-12 – 2011-08-13 (×2): 5 mg via ORAL
  Administered 2011-08-13 (×3): 10 mg via ORAL
  Administered 2011-08-14: 5 mg via ORAL
  Filled 2011-08-12 (×2): qty 1
  Filled 2011-08-12 (×2): qty 2
  Filled 2011-08-12: qty 1
  Filled 2011-08-12: qty 2

## 2011-08-12 MED ORDER — POVIDONE-IODINE 10 % EX SOLN
1.0000 "application " | Freq: Two times a day (BID) | CUTANEOUS | Status: DC
  Administered 2011-08-12 – 2011-08-14 (×4): 1 via TOPICAL
  Filled 2011-08-12: qty 15

## 2011-08-12 NOTE — Progress Notes (Signed)
2 Days Post-Op Procedure(s) (LRB): SUBXYPHOID PERICARDIAL WINDOW (N/A) Subjective:  No complaints  Objective: Vital signs in last 24 hours: Temp:  [98 F (36.7 C)-98.8 F (37.1 C)] 98.2 F (36.8 C) (01/06 0752) Pulse Rate:  [71-84] 73  (01/06 0900) Cardiac Rhythm:  [-] Normal sinus rhythm (01/06 0900) Resp:  [10-19] 15  (01/06 0900) BP: (87-133)/(45-79) 99/62 mmHg (01/06 0900) SpO2:  [94 %-100 %] 98 % (01/06 0900) Weight:  [68.6 kg (151 lb 3.8 oz)] 151 lb 3.8 oz (68.6 kg) (01/06 0700)  Hemodynamic parameters for last 24 hours:    Intake/Output from previous day: 01/05 0701 - 01/06 0700 In: 1015 [P.O.:690; I.V.:75; IV Piggyback:250] Out: 1295 [Urine:1255; Chest Tube:40] Intake/Output this shift: Total I/O In: -  Out: 65 [Urine:55; Chest Tube:10]  General appearance: alert and cooperative Heart: regular rate and rhythm, S1, S2 normal, no murmur, click, rub or gallop Lungs: clear to auscultation bilaterally Wound: dressing dry  Lab Results:  Basename 08/11/11 0400 08/09/11 2117  WBC 6.7 4.3  HGB 11.9* 11.8*  HCT 36.8 36.9  PLT 175 212   BMET:  Basename 08/12/11 0405 08/11/11 0400  NA 138 138  [name] 4.0 3.4*  CL 104 105  CO2 29 27  GLUCOSE 117* 121*  BUN 11 10  CREATININE 0.71 0.73  CALCIUM 9.2 8.8    PT/INR:  Basename 08/09/11 2117  LABPROT 13.6  INR 1.02   ABG    Component Value Date/Time   PHART 7.412* 08/09/2011 1930   HCO3 26.9* 08/09/2011 1930   TCO2 28.2 08/09/2011 1930   O2SAT 97.1 08/09/2011 1930   CBG (last 3)   Basename 08/10/11 0551  GLUCAP 103*    Assessment/Plan: S/P Procedure(s) (LRB): SUBXYPHOID PERICARDIAL WINDOW (N/A) DC pericardial tube IS, ambulation Transfer to 2000 followup on pathology and cytology tomorrow. Home when ambulating well   LOS: 3 days    Tina [name] 08/12/2011

## 2011-08-12 NOTE — Plan of Care (Signed)
Problem: Phase III Progression Outcomes Goal: Time patient transferred to PCTU/Telemetry POD Outcome: Completed/Met Date Met:  08/12/11 1715

## 2011-08-12 NOTE — Progress Notes (Signed)
Pt had incontinent, moderate sized, loose, foul-smelling stool. Stool brown in color, area surrounding stool on pad was maroon in color. Sent occult stool. Will continue to monitor.

## 2011-08-12 NOTE — Plan of Care (Signed)
Problem: Phase III Progression Outcomes Goal: Transfer to PCTU/Telemetry POD Outcome: Completed/Met Date Met:  08/12/11 1838

## 2011-08-12 NOTE — Progress Notes (Signed)
Transferred to 2038 via wheelchair with monitor and O2. Tolerated well. Park and walk completed and tolerated well. Report given to Dois Davenport, [place].

## 2011-08-13 ENCOUNTER — Encounter (HOSPITAL_COMMUNITY): Payer: Self-pay | Admitting: Surgery

## 2011-08-13 MED ORDER — OXYCODONE HCL 5 MG PO TABS: 5.0000 mg | ORAL_TABLET | ORAL | Status: DC | PRN

## 2011-08-13 NOTE — Progress Notes (Addendum)
[address]            [place] [address]          [phone]     3 Days Post-Op  Procedure(s) (LRB): SUBXYPHOID PERICARDIAL WINDOW (N/A) Subjective: Slowly feeling stronger, c/o incisional pain  Objective  Telemetry NSR  Temp:  [97.8 F (36.6 C)-98.4 F (36.9 C)] 98.3 F (36.8 C) (01/07 0422) Pulse Rate:  [73-87] 87  (01/07 0422) Resp:  [16-22] 18  (01/07 0422) BP: (95-134)/(44-80) 123/80 mmHg (01/07 0422) SpO2:  [92 %-100 %] 92 % (01/07 0843) Weight:  [150 lb 2.1 oz (68.1 kg)-151 lb 11.2 oz (68.811 kg)] 151 lb 11.2 oz (68.811 kg) (01/07 0422)   Intake/Output Summary (Last 24 hours) at 08/13/11 1133 Last data filed at 08/13/11 0800  Gross per 24 hour  Intake    480 ml  Output   1000 ml  Net   -520 ml       General appearance: cooperative and no distress Heart: regular rate and rhythm, S1, S2 normal and no rub Lungs: diminished BS  Wound: incision healing well  Lab Results:  Basename 08/12/11 0405 08/11/11 0400  NA 138 138  K 4.0 3.4*  CL 104 105  CO2 29 27  GLUCOSE 117* 121*  BUN 11 10  CREATININE 0.71 0.73  CALCIUM 9.2 8.8  MG -- --  PHOS -- --   No results found for this basename: AST:2,ALT:2,ALKPHOS:2,BILITOT:2,PROT:2,ALBUMIN:2 in the last 72 hours No results found for this basename: LIPASE:2,AMYLASE:2 in the last 72 hours  Basename 08/11/11 0400  WBC 6.7  NEUTROABS --  HGB 11.9*  HCT 36.8  MCV 97.6  PLT 175   No results found for this basename: CKTOTAL:4,CKMB:4,TROPONINI:4 in the last 72 hours No components found with this basename: POCBNP:3 No results found for this basename: DDIMER in the last 72 hours No results found for this basename: HGBA1C in the last 72 hours No results found for this basename: CHOL,HDL,LDLCALC,TRIG,CHOLHDL in the last 72 hours No results found for this basename: TSH,T4TOTAL,FREET3,T3FREE,THYROIDAB in the last 72 hours No results found for this basename:  VITAMINB12,FOLATE,FERRITIN,TIBC,IRON,RETICCTPCT in the last 72 hours  Medications: Scheduled    . levothyroxine  50 mcg Oral Daily  . mirtazapine  15 mg Oral QHS  . montelukast  10 mg Oral Daily  . moving right along book   Does not apply Once  . pantoprazole  40 mg Oral QAC breakfast  . povidone-iodine  1 application Topical BID  . sertraline  50 mg Oral Daily  . sodium chloride  3 mL Intravenous Q12H  . sodium chloride      . tiotropium  18 mcg Inhalation Daily  . DISCONTD: bisacodyl  10 mg Oral Daily  . DISCONTD: pantoprazole  40 mg Oral Q1200     Radiology/Studies:  No results found.  INR: Will add last result for INR, ABG once components are confirmed Will add last 4 CBG results once components are confirmed  Assessment/Plan: S/P Procedure(s) (LRB): SUBXYPHOID PERICARDIAL WINDOW (N/A) 1. Continues to progress, cont rehab, poss home in am   LOS: 4 days    GOLD,WAYNE E 1/7/201311:33 AM     Chart reviewed, patient examined, agree with above.  Pericardial bx and cytology of pericardial fluid negative for malignancy.  Reactive mesothelial cell consistent with pericarditis.  Discussed results with patient and daughter.  Daughter says she  is still having some balance problems with ambulation and has been having problems with this at home for some time.  It has not been worked up but daughter is going to discuss with her primary physician.  PT to evaluate for need for walker or cane.  Home after PT makes rec.

## 2011-08-13 NOTE — Progress Notes (Signed)
   CARE MANAGEMENT NOTE 08/13/2011  Patient:  Tina [name], Tina [name]   Account Number:  [id number]  Date Initiated:  08/13/2011  Documentation initiated by:  Damaris Geers  Subjective/Objective Assessment:   PT S/P SUBXYPHOID PERICARDIAL WINDOW.  PTA, PT INDEPENDENT, LIVES ALONE; HAS SUPPORTIVE DAUGHTER.     Action/Plan:   MET WITH PT AND DAUGHTER TO DISCUSS DC PLANNING.   Anticipated DC Date:  08/15/2011   Anticipated DC Plan:  HOME W HOME HEALTH SERVICES         Choice offered to / List presented to:             Status of service:  In process, will continue to follow Medicare Important Message given?   (If response is "NO", the following Medicare IM given date fields will be blank) Date Medicare IM given:   Date Additional Medicare IM given:    Discharge Disposition:    Per UR Regulation:    Comments:  08/13/11 Dorri Ozturk,RN,BSN 1545 MET WITH PT AND DAUGHTER TO DISCUSS DC PLANS.  DAUGHTER TO PROVIDE CARE AT DC.  SHE IS CONCERNED, AS PT SEEMS TO BE TAKING A LOT OF SEDATING MEDS...PT MOVED FROM [place] WITHIN THE LAST 2 YEARS AND PT IS ON ZOLOFT, XANAX AND REMERON. DTR STATES SHE ALSO HAS A VERY LARGE BOTTLE OF VICODIN AT HOME, AND IS UNCERTAIN HOW MUCH OF THIS SHE IS TAKING.  PT HAS HAD ISSUES WITH BALANCE PRIOR TO ADMISSION, PER DTR. WILL HAVE PHYSCIAL THERAPIST EVALUATE PRIOR TO DC HOME TO SEE IF HOME THERAPY MIGHT BE NEEDED.  DTR REASSURED BY THIS PLAN.  WILL FOLLOW FOR RESULTS OF PT CONSULT. Phone #[phone]

## 2011-08-13 NOTE — Progress Notes (Signed)
   SUBJECTIVE:  Still with incisional chest pain.     PHYSICAL EXAM Filed Vitals:   08/12/11 1700 08/12/11 1849 08/12/11 2009 08/13/11 0422  BP: 95/54 134/70 102/70 123/80  Pulse:  84 78 87  Temp:  97.8 F (36.6 C) 98.1 F (36.7 C) 98.3 F (36.8 C)  TempSrc:  Oral Oral Oral  Resp: 16 22 19 18   Height:      Weight:  68.1 kg (150 lb 2.1 oz)  68.811 kg (151 lb 11.2 oz)  SpO2:  95% 99% 93%   General:  No distress Lungs:  Bilateral basilar crackles Heart:  RRR, no rub Abdomen:  Positive bowel sounds, no rebound no guarding Extremities:  No edema  LABS: No results found for this basename: CKTOTAL, CKMB, CKMBINDEX, TROPONINI   No results found for this or any previous visit (from the past 24 hour(s)).  Intake/Output Summary (Last 24 hours) at 08/13/11 0842 Last data filed at 08/13/11 0300  Gross per 24 hour  Intake    360 ml  Output   1120 ml  Net   -760 ml     ASSESSMENT AND PLAN: Pericardial effusion:  Pericardial tissue with inflammation.  Status post window.  Slow recovery.  Continue current therapy.  Ambulate/rehab.    Fayrene Fearing [facility] 08/13/2011 8:42 AM

## 2011-08-13 NOTE — Discharge Summary (Signed)
[address]            [place] [address]          [phone]      Tina [name] [birth year] 66 y.o. [id number]  08/09/2011   Tina Borne, MD  pericardial effusion/tamponade  HPI: At time of cardiology evaluation. The patient is a 66 year old female with a history of small cell lung cancer in remission after completing chemotherapy almost 2 years ago. She is followed by Tina [name] at the cancer Center in [place], [place]. The patient for quite some time has had a large pericardial effusion with concern for malignant effusion. I initially referred the patient earlier last year in [place] for a pericardial window. Unfortunately they felt this was not indicated. Subsequently the patient was followed with a followup echocardiogram which now showed that the patient had pericardial tamponade. I asked Tina [name] to see the patient in October of 2012 and he suggested we proceeded with pericardiocentesis rather than pericardial window. Successful pericardiocentesis was performed and 950 cc of yellowish colored fluid was removed without complications. Cytology showed no malignant cells. There was evidence of chronic inflammation and the patient was placed on colchicine 0.6 mg by mouth daily which the patient still taking. At that time also he was noted that she was hypothyroid with TSH 100. Synthroid therapy was begun in October 2012, and recently the patient received a call from her primary care physician but her TSH was within normal limits.  The patient presented today routinely to the echo lab for followup echocardiogram. He was noted again that she had a very large pericardial effusion again with him to not physiology with RV collapse and RA inversion. Vital signs were stable and the patient was not tachycardic. However by echocardiographic parameters the effusion was massive which cleared up and not physiology. I told the patient that I could not send her  home and that we should proceed with pericardial window. This was actually my initial intention earlier last year particularly because I am still suspicious that the patient may have a malignant effusion, despite negative cytology by pericardiocentesis. Pericardial window will allow [place] to get tissue from the pericardium and may be more diagnostic. I asked the patient to call her family and we will refer her to [facility] for admission.  Interestingly, the patient denies any chest pain or shortness of breath both at rest on exertion. She has no orthopnea PND she reports no palpitations or syncope. She was referred to [facility] for cardiothoracic surgical consultation and was seen by Tina [name]. It was felt that she would require subxiphoid pericardial window. The procedure was subtotally scheduled for 08/10/2011. PMH: reviewed and listed in Problem List in Electronic Records (and see below)  Past Medical History   Diagnosis  Date   .  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  S/P adenoidectomy    .  S/P cataract surgery      5x   .  Pericardial effusion      Unclear etiology status post pericardiocentesis October 2012 status post removal of 950 cc of yellowish colored fluid, no malignant cells by cytology evidence for chronic inflammation   .  Supraventricular tachycardia      developed after pericardiocentesis.   .  Hypothyroidism  TSH greater than 100 November 2012 started on replacement therapy normal thyroid function studies December 2012    No past surgical history on file.  Allergies/SH/FHX : available in Electronic Records for review  Allergies   Allergen  Reactions   .  Ibuprofen      REACTION: throat swelling    History    Social History   .  Marital Status:  Divorced     Spouse Name:  N/A     Number of Children:  N/A   .  Years of Education:  N/A    Occupational History   .  Not on file.      Social History Main Topics   .  Smoking status:  Former Smoker -- 1.0 packs/day for 30 years     Types:  Cigarettes     Quit date:  06/06/2008   .  Smokeless tobacco:  Never Used   .  Alcohol Use:  No   .  Drug Use:  No   .  Sexually Active:  Not on file    Other Topics  Concern   .  Not on file    Social History Narrative    Patient had 2 years of college and did well. 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    Family History   Problem  Relation  Age of Onset   .  Lung cancer  Father    .  Stomach cancer  Paternal Aunt     Medications:    Review of Systems: At time of cardiothoracic surgical consultation Cardiac Review of Systems: Y or N  Chest Pain [ y ] Resting SOB Cove.Etienne ] Exertional SOB Cove.Etienne ] Orthopnea Cove.Etienne ]  Pedal Edema [ b ] Palpitations [ b ] Syncope [b ] Presyncope [n ]  General Review of Systems: [Y] = yes [ ] =no  Constitional: recent weight change [ ] ; anorexia [ ] ; fatigue [ ] ; nausea [ ] ; night sweats [ ] ; fever [ ] ; or chills [ ] ;  Dental: poor dentition[ ] ;  Eye : blurred vision [ ] ; diplopia [ ] ; vision changes [ ] ; Amaurosis fugax[ ] ;  Resp: cough Cove.Etienne ]; wheezing[ ] ; hemoptysis[ ] ; shortness of breath[y ]; paroxysmal nocturnal dyspnea[ y ]; dyspnea on exertion[y ]; or orthopnea[y ];  GI: gallstones[ ] , vomiting[ ] ; dysphagia[ ] ; melena[ ] ; hematochezia [ ] ; heartburn[ ] ; Hx of Colonoscopy[ ] ;  GU: kidney stones [ ] ; hematuria[ ] ; dysuria [ ] ; nocturia[ ] ; history of obstruction [ ] ;  Skin: rash, swelling[ ] ;, hair loss[ ] ; peripheral edema[ ] ; or itching[ ] ;  Musculosketetal: myalgias[ ] ; joint swelling[ ] ; joint erythema[ ] ;  joint pain[ ] ; back pain[ ] ;  Heme/Lymph: bruising[ ] ; bleeding[ ] ; anemia[ ] ;  Neuro: TIA[ ] ; headaches[ ] ; stroke[ ] ; vertigo[ ] ; seizures[ ] ; paresthesias[ ] ; difficulty walking[ ] ;  Psych:depression[ ] ; anxiety[ ] ;  Endocrine: diabetes[ ] ; thyroid dysfunction[ ] ;   Immunizations: Flu [ ] ; Pneumococcal[ ] ;  Other:  Physical Exam: At time of cardiothoracic surgical consultation BP 122/70  Pulse 68  Temp(Src) 97.7 F (36.5 C) (Oral)  Resp 20  Ht 5\' 1"  (1.549 m)  Wt 149 lb 7.6 oz (67.8 kg)  BMI 28.24 kg/m2  SpO2 100%  General appearance: alert, cooperative, no distress and mildly obese  Neurologic: intact  Heart: distant heart sounds  Lungs: clear to auscultation bilaterally  Abdomen: soft, non-tender; bowel sounds normal; no masses,  no organomegaly  Extremities: extremities normal, atraumatic, no cyanosis or edema, Homans sign is negative, no sign of DVT and no edema, redness or tenderness in the calves or thighs  Diagnostic Studies & Laboratory data:  Recent Radiology Findings:  No results found.  Recent Lab Findings:  Lab Results   Component  Value  Date    WBC  5.0  08/09/2011    HGB  12.7  08/09/2011    HCT  39.8  08/09/2011    PLT  228  08/09/2011    GLUCOSE  87  08/09/2011    ALT  11  08/09/2011    AST  14  08/09/2011    NA  141  08/09/2011    K  3.4*  08/09/2011    CL  104  08/09/2011    CREATININE  0.84  08/09/2011    BUN  12  08/09/2011    CO2  31  08/09/2011    INR  1.01  08/09/2011    Assessment / Plan: At time of cardiothoracic surgical consultation. Agree with Dr Earnestine Leys. Have recommended pericardial window and drainage for DX and therapeutic reason.  The goals risks and alternatives of the planned surgical procedure Pericardial window have been discussed with the patient in detail. The risks of the procedure including death, infection, stroke, myocardial infarction, bleeding, blood transfusion have all been discussed specifically. I have quoted Prentice Docker a 4% of perioperative mortality and a complication rate as high as 15 %. The patient's questions have been answered.GWENNETH WHITEMAN is willing to proceed with the planned procedure.Dr Laneta Simmers is available in am to preform surgery.     Procedure: On 08/10/2011 the patient was taken the operating  room and underwent the following procedure:  OPERATIVE REPORT  PREOPERATIVE DIAGNOSIS: Large pericardial effusion.  POSTOPERATIVE DIAGNOSIS: Large pericardial effusion.  PROCEDURE: Subxiphoid pericardial window.  SURGEON: Evelene Croon, M.D.  ASSISTANT: None.  ANESTHESIA: General endotracheal.   Postoperative hospital course: Pathology and cytology is negative for malignancy. Please see the report for full details. She is progressing nicely albeit somewhat slowly in regards to her physical rehabilitation. Ambulation is improving with time. She has been weaned from oxygen and maintained adequate saturations on room air. Incision is healing nicely without evidence of infection. Currently her status is felt to be quite stable for tendon discharge in the morning pending reevaluation.    Basename 08/12/11 0405 08/11/11 0400  NA 138 138  K 4.0 3.4*  CL 104 105  CO2 29 27  GLUCOSE 117* 121*  BUN 11 10  CALCIUM 9.2 8.8    Basename 08/11/11 0400  WBC 6.7  HGB 11.9*  HCT 36.8  PLT 175   No results found for this basename: INR:2 in the last 72 hours   Discharge Instructions:  The patient is discharged to home with extensive instructions on wound care and progressive ambulation.  They are instructed not to drive or perform any heavy lifting until returning to see the physician in his office.  Discharge Diagnosis:  pericardial effusion/tamponade  Secondary Diagnosis: Patient Active Problem List  Diagnoses  . HYPERTENSION  . PERICARDIAL EFFUSION  . ESOPHAGEAL REFLUX  . PERS HX TOBACCO USE PRESENTING HAZARDS HEALTH  . ANXIETY DISORDER  . Lung cancer  . Hypothyroidism  . Supraventricular tachycardia  . Pericardial effusion   Past Medical History  Diagnosis Date  . 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  S/P adenoidectomy   . S/P cataract surgery     5x  . Pericardial effusion     Unclear etiology status  post pericardiocentesis October 2012 status post removal of 950 cc of yellowish colored fluid, no malignant cells by cytology evidence for chronic inflammation  . Supraventricular tachycardia      developed after pericardiocentesis.  . Hypothyroidism      TSH greater than 100 November 2012 started on replacement therapy normal thyroid function studies December 2012  . Heart murmur   . Asthma   . COPD (chronic obstructive pulmonary disease)   . Shortness of breath   . Anxiety   . Blood transfusion     " NO REACTION TO TRANSFUSION "  . GERD (gastroesophageal reflux disease)        Donnesha, Karg  Home Medication Instructions [id number]   Printed on:08/13/11 1340  Medication Information                    Multiple Vitamins-Minerals (MULTIVITAL) tablet Take 1 tablet by mouth daily.             tiotropium (SPIRIVA HANDIHALER) 18 MCG inhalation capsule Place 18 mcg into inhaler and inhale daily.             sertraline (ZOLOFT) 50 MG tablet Take 50 mg by mouth daily.             colchicine 0.6 MG tablet Take 0.6 mg by mouth daily.             albuterol (PROVENTIL HFA;VENTOLIN HFA) 108 (90 BASE) MCG/ACT inhaler Inhale 2 puffs into the lungs every 6 (six) hours as needed. Shortness of breath            ALPRAZolam (XANAX) 0.5 MG tablet Take 0.5 mg by mouth 3 (three) times daily as needed. For anxiety            mirtazapine (REMERON) 15 MG tablet Take 15 mg by mouth daily at 8 pm.             montelukast (SINGULAIR) 10 MG tablet Take 10 mg by mouth daily at 8 pm.             levothyroxine (SYNTHROID, LEVOTHROID) 50 MCG tablet Take 50 mcg by mouth every morning.             oxyCODONE (OXY IR/ROXICODONE) 5 MG immediate release tablet Take 1-2 tablets (5-10 mg total) by mouth every 4 (four) hours as needed.             Disposition: Home  Patient's condition is Good  Gershon Crane, PA-C 08/13/2011  1:40 PM

## 2011-08-14 NOTE — Progress Notes (Signed)
   CARE MANAGEMENT NOTE 08/14/2011  Patient:  Tina [name], Tina [name]   Account Number:  [id number]  Date Initiated:  08/13/2011  Documentation initiated by:  Katelen Luepke  Subjective/Objective Assessment:   PT S/P SUBXYPHOID PERICARDIAL WINDOW.  PTA, PT INDEPENDENT, LIVES ALONE; HAS SUPPORTIVE DAUGHTER.     Action/Plan:   MET WITH PT AND DAUGHTER TO DISCUSS DC PLANNING.   Anticipated DC Date:  08/15/2011   Anticipated DC Plan:  HOME W HOME HEALTH SERVICES         Choice offered to / List presented to:     DME arranged  Levan Hurst      DME agency  [facility]        Status of service:  Completed, signed off Medicare Important Message given?   (If response is "NO", the following Medicare IM given date fields will be blank) Date Medicare IM given:   Date Additional Medicare IM given:    Discharge Disposition:  HOME/SELF CARE  Per UR Regulation:    Comments:  08/13/10 Kaytelyn Glore,RN,BSN 1350 PT CONSULT COMPLETED.  RECOMMENDATION IS FOR RW FOR HOME AND OUTPT PHYSICAL THERAPY.  REFERRAL TO JUSTIN WITH [facility] FOR DME NEEDS.  PT GIVEN RX FOR OUTPT PHYSICAL THERAPY. SHE MAY TAKE RX TO REHAB CTR OF HER CHOOSING IN [place].  DAUGHTER STATES SHE AND HER HUSBAND CAN TRANPORT PT TO THERAPY. Phone #[phone]   08/13/11 Adell Panek,RN,BSN 1545 MET WITH PT AND DAUGHTER TO DISCUSS DC PLANS.  DAUGHTER TO PROVIDE CARE AT DC.  SHE IS CONCERNED, AS PT SEEMS TO BE TAKING A LOT OF SEDATING MEDS...PT MOVED FROM [place] WITHIN THE LAST 2 YEARS AND PT IS ON ZOLOFT, XANAX AND REMERON. DTR STATES SHE ALSO HAS A VERY LARGE BOTTLE OF VICODIN AT HOME, AND IS UNCERTAIN HOW MUCH OF THIS SHE IS TAKING.  PT HAS HAD ISSUES WITH BALANCE PRIOR TO ADMISSION, PER DTR. WILL HAVE PHYSCIAL THERAPIST EVALUATE PRIOR TO DC HOME TO SEE IF HOME THERAPY MIGHT BE NEEDED.  DTR REASSURED BY THIS PLAN.  WILL FOLLOW FOR RESULTS OF PT CONSULT.

## 2011-08-14 NOTE — Progress Notes (Signed)
                    [address]            [place] [address]          [phone]     4 Days Post-Op Procedure(s) (LRB): SUBXYPHOID PERICARDIAL WINDOW (N/A)  Subjective: Sore, but otherwise feels ok.  Objective: Vital signs in last 24 hours: Patient Vitals for the past 24 hrs:  BP Temp Temp src Pulse Resp SpO2 Weight  08/14/11 0432 99/62 mmHg 98.4 F (36.9 C) Oral 70  18  93 % 67.8 kg (149 lb 7.6 oz)  08/13/11 2020 104/60 mmHg 98.4 F (36.9 C) Oral 76  18  93 % -  08/13/11 1345 97/65 mmHg 98.1 F (36.7 C) Oral 78  18  95 % -  08/13/11 0843 - - - - - 92 % -   Current Weight  08/14/11 67.8 kg (149 lb 7.6 oz)     Intake/Output from previous day: 01/07 0701 - 01/08 0700 In: 963 [P.O.:960; I.V.:3] Out: 500 [Urine:500]    PHYSICAL EXAM:  Heart: RRR Lungs: clear Wound: incisions clean and dry   Lab Results: CBC:No results found for this basename: WBC:2,HGB:2,HCT:2,PLT:2 in the last 72 hours BMET:  Basename 08/12/11 0405  NA 138  K 4.0  CL 104  CO2 29  GLUCOSE 117*  BUN 11  CREATININE 0.71  CALCIUM 9.2    PT/INR: No results found for this basename: LABPROT,INR in the last 72 hours   Assessment/Plan: S/P Procedure(s) (LRB): SUBXYPHOID PERICARDIAL WINDOW (N/A) Doing well. PT to see this am re: d/c needs Home today if cleared by PT   LOS: 5 days    Jasmynn Pfalzgraf H 08/14/2011

## 2011-08-14 NOTE — Progress Notes (Signed)
Physical Therapy Evaluation Patient Details Name: Tina [name] MRN: [id number] DOB: [birth year] Today's Date: 08/14/2011  Problem List:  Patient Active Problem List  Diagnoses  . HYPERTENSION  . PERICARDIAL EFFUSION  . ESOPHAGEAL REFLUX  . PERS HX TOBACCO USE PRESENTING HAZARDS HEALTH  . ANXIETY DISORDER  . Lung cancer  . Hypothyroidism  . Supraventricular tachycardia  . Pericardial effusion    Past Medical History:  Past Medical History  Diagnosis Date  . 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 S/P adenoidectomy   . S/P cataract surgery     5x  . Pericardial effusion     Unclear etiology status post pericardiocentesis October 2012 status post removal of 950 cc of yellowish colored fluid, no malignant cells by cytology evidence for chronic inflammation  . Supraventricular tachycardia      developed after pericardiocentesis.  . Hypothyroidism      TSH greater than 100 November 2012 started on replacement therapy normal thyroid function studies December 2012  . Heart murmur   . Asthma   . COPD (chronic obstructive pulmonary disease)   . Shortness of breath   . Anxiety   . Blood transfusion     " NO REACTION TO TRANSFUSION "  . GERD (gastroesophageal reflux disease)    Past Surgical History:  Past Surgical History  Procedure Date  . Fibroid tumors   . Percencentesis   . Subxyphoid pericardial window 08/10/2011    Procedure: SUBXYPHOID PERICARDIAL WINDOW;  Surgeon: [name], MD;  Location: [facility] OR;  Service: Open Heart Surgery;  Laterality: N/A;  Subxyphiod Pericardial Window for Drainage of Pericadial Fluid and Chest Tube Placement    PT Assessment/Plan/Recommendation PT Assessment Clinical Impression Statement: Pt admitted for pericardial window and presents with balance deficits in need of further therapy. Discussed with pt and dgtr need for RW, shower seat and OPPT which they were both agreeable  too. Also recommend pt put litter box on an elevated surface if possible. All acute education provided and pt safe for DC once DME arrives with planned OPPT followup. PT Recommendation/Assessment: All further PT needs can be met in the next venue of care PT Problem List: Decreased balance;Decreased knowledge of use of DME PT Therapy Diagnosis : Difficulty walking;Abnormality of gait PT Recommendation Follow Up Recommendations: Outpatient PT Equipment Recommended: Rolling walker with 5" wheels PT Goals     PT Evaluation Precautions/Restrictions  Precautions Precautions: Fall Restrictions Weight Bearing Restrictions: No Prior Functioning  Home Living Lives With: Alone;Other (Comment) (dgtr across the street) Type of Home: Mobile home Home Layout: One level Home Access: Stairs to enter [employer] of Steps: 1 Bathroom Shower/Tub: Health visitor: Standard Home Adaptive Equipment: None Prior Function Level of Independence: Independent with basic ADLs;Independent with transfers;Independent with homemaking with ambulation;Independent with gait Driving: Yes Vocation: Retired Comments: dgtr able to stay for at least the 1st week 24hrs Cognition Cognition Arousal/Alertness: Awake/alert Overall Cognitive Status: Appears within functional limits for tasks assessed Orientation Level: Oriented X4 Sensation/Coordination Sensation Light Touch: Appears Intact Stereognosis: Not tested Hot/Cold: Not tested Proprioception: Not tested Extremity Assessment RLE Assessment RLE Assessment: Within Functional Limits LLE Assessment LLE Assessment: Within Functional Limits Mobility (including Balance) Bed Mobility Bed Mobility: Yes Rolling Left: 5: Supervision Rolling Left Details (indicate cue type and reason): cueing to sequence, dgtr present for education Left Sidelying to Sit: 4: Min assist;HOB flat Left Sidelying to Sit Details (indicate cue type  and reason):  cueing to elevate trunk  Sitting - Scoot to Edge of Bed: 6: Modified independent (Device/Increase time) Transfers Transfers: Yes Sit to Stand: 6: Modified independent (Device/Increase time);From bed Stand to Sit: To chair/3-in-1;5: Supervision Stand to Sit Details: cueing to control descent and use armrests to control Ambulation/Gait Ambulation/Gait: Yes Ambulation/Gait Assistance: 4: Min assist Ambulation/Gait Assistance Details (indicate cue type and reason): secondary to unsteady with periods of scissoring and veering to R Ambulation Distance (Feet): 400 Feet (22ft with RW supervision) Assistive device: None Gait Pattern: Decreased stride length Gait velocity: 30/20=1.5 ft/sec placing pt as an increased fall risk Stairs: Yes Stairs Assistance: 4: Min assist Stairs Assistance Details (indicate cue type and reason): Hand held assist for safety. Discussed use of RW backward and practiced with shower stall Stair Management Technique: Step to pattern Number of Stairs: 2  Height of Stairs: 8  Wheelchair Mobility Wheelchair Mobility: No  Posture/Postural Control Posture/Postural Control: No significant limitations Balance Balance Assessed: Yes Dynamic Standing Balance Dynamic Standing - Balance Support: During functional activity Dynamic Standing - Level of Assistance: 4: Min assist Dynamic Standing - Comments: pt with posterior lean on initial standing, and unsteady with gait Exercise    End of Session PT - End of Session Equipment Utilized During Treatment: Gait belt Activity Tolerance: Patient tolerated treatment well Patient left: in chair;with call bell in reach;with family/visitor present Nurse Communication: Mobility status for transfers;Mobility status for ambulation General Behavior During Session: [facility] for tasks performed Cognition: [facility] for tasks performed  Delorse Lek 08/14/2011, 2:02 PM Toney Sang, PT [phone]

## 2011-08-20 ENCOUNTER — Encounter (INDEPENDENT_AMBULATORY_CARE_PROVIDER_SITE_OTHER): Payer: Self-pay

## 2011-08-20 DIAGNOSIS — I309 Acute pericarditis, unspecified: Secondary | ICD-10-CM

## 2011-08-23 ENCOUNTER — Other Ambulatory Visit: Payer: Self-pay | Admitting: Surgery

## 2011-08-23 DIAGNOSIS — I314 Cardiac tamponade: Secondary | ICD-10-CM

## 2011-08-28 ENCOUNTER — Ambulatory Visit
Admission: RE | Admit: 2011-08-28 | Discharge: 2011-08-28 | Disposition: A | Discharge status: Home / Self Care | Payer: Medicare Other | Source: Ambulatory Visit | Attending: Surgery | Admitting: Surgery

## 2011-08-28 ENCOUNTER — Encounter: Payer: Self-pay | Admitting: Surgery

## 2011-08-28 ENCOUNTER — Ambulatory Visit (INDEPENDENT_AMBULATORY_CARE_PROVIDER_SITE_OTHER): Payer: Self-pay | Admitting: Surgery

## 2011-08-28 VITALS — BP 130/73 | HR 69 | Resp 14 | Ht 61.0 in | Wt 148.0 lb

## 2011-08-28 DIAGNOSIS — I314 Cardiac tamponade: Secondary | ICD-10-CM

## 2011-08-28 DIAGNOSIS — Z9889 Other specified postprocedural states: Secondary | ICD-10-CM | POA: Insufficient documentation

## 2011-08-28 NOTE — Progress Notes (Signed)
[address]            [place] [address]          [phone]     HPI: Patient returns for routine postoperative follow-up having undergone subxiphoid pericardial window on 08/10/2011. The patient's early postoperative recovery while in the hospital was notable for an uncomplicated postoperative course. The cytology of her pericardial fluid was negative for malignancy. Pericardial biopsy showed chronic inflammation but no evidence of malignancy. Since hospital discharge the patient reports she has been feeling better. Her shortness of breath has resolved..   Current Outpatient Prescriptions  Medication Sig Dispense Refill  . albuterol (PROVENTIL HFA;VENTOLIN HFA) 108 (90 BASE) MCG/ACT inhaler Inhale 2 puffs into the lungs every 6 (six) hours as needed. Shortness of breath       . ALPRAZolam (XANAX) 0.5 MG tablet Take 0.5 mg by mouth 3 (three) times daily as needed. For anxiety       . colchicine 0.6 MG tablet Take 0.6 mg by mouth daily.        Marland Kitchen levothyroxine (SYNTHROID, LEVOTHROID) 50 MCG tablet Take 50 mcg by mouth every morning.        . mirtazapine (REMERON) 15 MG tablet Take 15 mg by mouth daily at 8 pm.        . montelukast (SINGULAIR) 10 MG tablet Take 10 mg by mouth daily at 8 pm.        . Multiple Vitamins-Minerals (MULTIVITAL) tablet Take 1 tablet by mouth daily.        Marland Kitchen oxyCODONE (OXY IR/ROXICODONE) 5 MG immediate release tablet Take 1-2 tablets (5-10 mg total) by mouth every 4 (four) hours as needed.  50 tablet  0  . sertraline (ZOLOFT) 50 MG tablet Take 50 mg by mouth daily.        Marland Kitchen tiotropium (SPIRIVA HANDIHALER) 18 MCG inhalation capsule Place 18 mcg into inhaler and inhale daily.          Physical Exam:  BP 130/73  Pulse 69  Resp 14  Ht 5\' 1"  (1.549 m)  Wt 148 lb (67.132 kg)  BMI 27.96 kg/m2  SpO2 97%  She looks well. Cardiac exam shows regular rate and rhythm with normal heart sounds. Lung exam is clear. The subxiphoid  incision is healing well. There is no sign of a hernia. There is no peripheral edema.  Diagnostic Tests:  *RADIOLOGY REPORT*   Clinical Data: History of pericardial window, follow-up.   CHEST - 2 VIEW   Comparison: 08/11/2011.    Findings: Trachea is midline.  Heart size stable.  Biapical pleural parenchymal scarring.  Minimal linear scarring in the lower left hemithorax with probable developing scar in the left hilar region. No pneumothorax.  No pleural fluid.   IMPRESSION: Probable linear scarring in the lower left hemithorax.  Suspect developing scarring in the left hilar region.   Original Report Authenticated By: Reyes Ivan, M.D.   Impression:  Tina [name] is doing well following her subxiphoid pericardial window. She had no evidence of malignancy which was surprising since the fluid was serosanguineous with her prior history of lung cancer. She appears to be doing well at this time. Further followup can be done by Dr. Cleone Slim who has been following her for prior lung cancer. I asked her not to lift anything heavier than 10 pounds for about 3 months to allow her incision  to completely heal and to minimize the chance of getting a ventral hernia.  Plan:  She is planning to followup with Dr.Karb and Dr. Andee Lineman.

## 2011-10-05 ENCOUNTER — Ambulatory Visit (INDEPENDENT_AMBULATORY_CARE_PROVIDER_SITE_OTHER): Payer: Medicare Other | Admitting: Cardiology

## 2011-10-05 ENCOUNTER — Encounter: Payer: Self-pay | Admitting: Cardiology

## 2011-10-05 VITALS — BP 125/76 | HR 66 | Ht 61.0 in | Wt 150.0 lb

## 2011-10-05 DIAGNOSIS — I313 Pericardial effusion (noninflammatory): Secondary | ICD-10-CM

## 2011-10-05 DIAGNOSIS — I319 Disease of pericardium, unspecified: Secondary | ICD-10-CM

## 2011-10-05 MED ORDER — COLCHICINE 0.6 MG PO TABS: 0.6000 mg | ORAL_TABLET | Freq: Every day | ORAL | Status: DC

## 2011-10-05 NOTE — Patient Instructions (Signed)
   Colchicine 0.6mg  daily Your physician wants you to follow up in: 6 months.  You will receive a reminder letter in the mail one-two months in advance.  If you don't receive a letter, please call our office to schedule the follow up appointment

## 2011-10-05 NOTE — Progress Notes (Signed)
Tina Bottoms, MD, [facility] ABIM Board Certified in Adult Cardiovascular Medicine,Internal Medicine and Critical Care Medicine    CC: Followup patient after pericardial window for chronic pericardial effusion  HPI:  The patient is a 66 year old female with a history of lung cancer status post prior chemotherapy radiation therapy. For several years she had a large pericardial effusion which was initially drained via pericardiocentesis but had rapid reaccumulation within a matter of 2 weeks. She was then referred to Dr. Laneta Simmers for pericardial window. Cytology was negative for malignancy as well as histology of the pericardial tissue. Findings were consistent with chronic inflammation likely related to her prior history of radiation therapy. After her pericardiocentesis the patient stopped taking colchicine. She states that she is dramatically improved. She states "I cannot believe how much less short of breath I am". I told the patient that I wanted to remove his fluid several years ago and actually send her to a surgeon in [place] but was told this was not necessary. The patient has now resumed regular activity. She also feels that her depression has lifted because of her quality of life has improved so much.  PMH: reviewed and listed in Problem List in Electronic Records (and see below) Past Medical History  Diagnosis Date  . 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 S/P adenoidectomy   . S/P cataract surgery     5x  . Pericardial effusion     Unclear etiology status post pericardiocentesis October 2012 status post removal of 950 cc of yellowish colored fluid, no malignant cells by cytology evidence for chronic inflammation  . Supraventricular tachycardia      developed after pericardiocentesis.  . Hypothyroidism      TSH greater than 100 November 2012 started on replacement therapy normal thyroid function studies December 2012    . Heart murmur   . Asthma   . COPD (chronic obstructive pulmonary disease)   . Shortness of breath   . Anxiety   . Blood transfusion     " NO REACTION TO TRANSFUSION "  . GERD (gastroesophageal reflux disease)    Past Surgical History  Procedure Date  . Fibroid tumors   . Percencentesis   . Subxyphoid pericardial window 08/10/2011    Procedure: SUBXYPHOID PERICARDIAL WINDOW;  Surgeon: [name], MD;  Location: [facility] OR;  Service: Open Heart Surgery;  Laterality: N/A;  Subxyphiod Pericardial Window for Drainage of Pericadial Fluid and Chest Tube Placement    Allergies/SH/FHX : available in Electronic Records for review  Allergies  Allergen Reactions  . Ibuprofen     REACTION: throat swelling   History   Social History  . Marital Status: Divorced    Spouse Name: N/A    Number of Children: N/A  . Years of Education: N/A   Occupational History  . Not on file.   Social History Main Topics  . Smoking status: Former Smoker -- 1.0 packs/day for 30 years    Types: Cigarettes    Quit date: 06/06/2008  . Smokeless tobacco: Never Used  . Alcohol Use: No  . Drug Use: No  . Sexually Active: No   Other Topics Concern  . Not on file   Social History Narrative   Patient had 2 years of college and did well. 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    Family History  Problem Relation Age of Onset  . Lung cancer Father   . Stomach cancer Paternal Aunt     Medications: Current Outpatient Prescriptions  Medication Sig Dispense Refill  . albuterol (PROVENTIL HFA;VENTOLIN HFA) 108 (90 BASE) MCG/ACT inhaler Inhale 2 puffs into the lungs every 6 (six) hours as needed. Shortness of breath       . ALPRAZolam (XANAX) 0.5 MG tablet Take 0.5 mg by mouth 3 (three) times daily as needed. For anxiety       . levothyroxine (SYNTHROID, LEVOTHROID) 50 MCG tablet Take 50 mcg by mouth every morning.        . mirtazapine (REMERON)  15 MG tablet Take 15 mg by mouth daily at 8 pm.        . montelukast (SINGULAIR) 10 MG tablet Take 10 mg by mouth daily at 8 pm.        . Multiple Vitamins-Minerals (MULTIVITAL) tablet Take 1 tablet by mouth daily.        Marland Kitchen oxyCODONE (OXY IR/ROXICODONE) 5 MG immediate release tablet Take 1-2 tablets (5-10 mg total) by mouth every 4 (four) hours as needed.  50 tablet  0  . sertraline (ZOLOFT) 50 MG tablet Take 50 mg by mouth daily.        Marland Kitchen tiotropium (SPIRIVA HANDIHALER) 18 MCG inhalation capsule Place 18 mcg into inhaler and inhale daily.        . colchicine 0.6 MG tablet Take 1 tablet (0.6 mg total) by mouth daily.  30 tablet  6    ROS: No nausea or vomiting. No fever or chills.No melena or hematochezia.No bleeding.No claudication  Physical Exam: BP 125/76  Pulse 66  Ht 5\' 1"  (1.549 m)  Wt 150 lb (68.04 kg)  BMI 28.34 kg/m2  SpO2 100% General: Neck: Well-nourished white female in no distress Lungs: No  Kussmaul sign. JVP is 6 cm Cardiac: Regular rate and rhythm without pericardial friction rub and normal S1-S2 Vascular: No edema Skin: Warm and dry Physcologic: Normal affect  12lead ECG: Limited bedside ECHO:N/A No images are attached to the encounter.    Patient Active Problem List  Diagnoses  . HYPERTENSION  . ESOPHAGEAL REFLUX  . PERS HX TOBACCO USE PRESENTING HAZARDS HEALTH  . ANXIETY DISORDER  . Lung cancer-status post chemotherapy and radiation therapy   . Hypothyroidism-on replacement therapy.   . Supraventricular tachycardia  . Pericardial effusion-secondary to chronic inflammation   . S/P pericardial operation-status post subxiphoid window     PLAN   The patient is dramatically improved. She is less short of breath.  Pericardial fluid and tissue is consistent with chronic inflammation likely related to prior radiation therapy. I told the patient it was still be reasonable to take colchicine 0.6 mg by mouth daily for 6 months.  I will see the patient back  in 6 months.  I encouraged her to keep up with her physical therapy.  Now that she's feeling better she feels she's not depressed anymore and she may want to talk to her primary care physician about thinking weaning her Zoloft over several weeks.

## 2012-01-29 ENCOUNTER — Encounter: Payer: Medicare Other | Admitting: Hematology and Oncology

## 2012-01-29 DIAGNOSIS — C3490 Malignant neoplasm of unspecified part of unspecified bronchus or lung: Secondary | ICD-10-CM

## 2012-01-29 DIAGNOSIS — I319 Disease of pericardium, unspecified: Secondary | ICD-10-CM

## 2012-04-15 IMAGING — CR DG CHEST 1V PORT
1 series · 1 of 1 positions shown · non-contrast
Comparison: August 10, 2011

CLINICAL DATA: Postop pericardial window

PORTABLE CHEST - 1 VIEW

[AP]
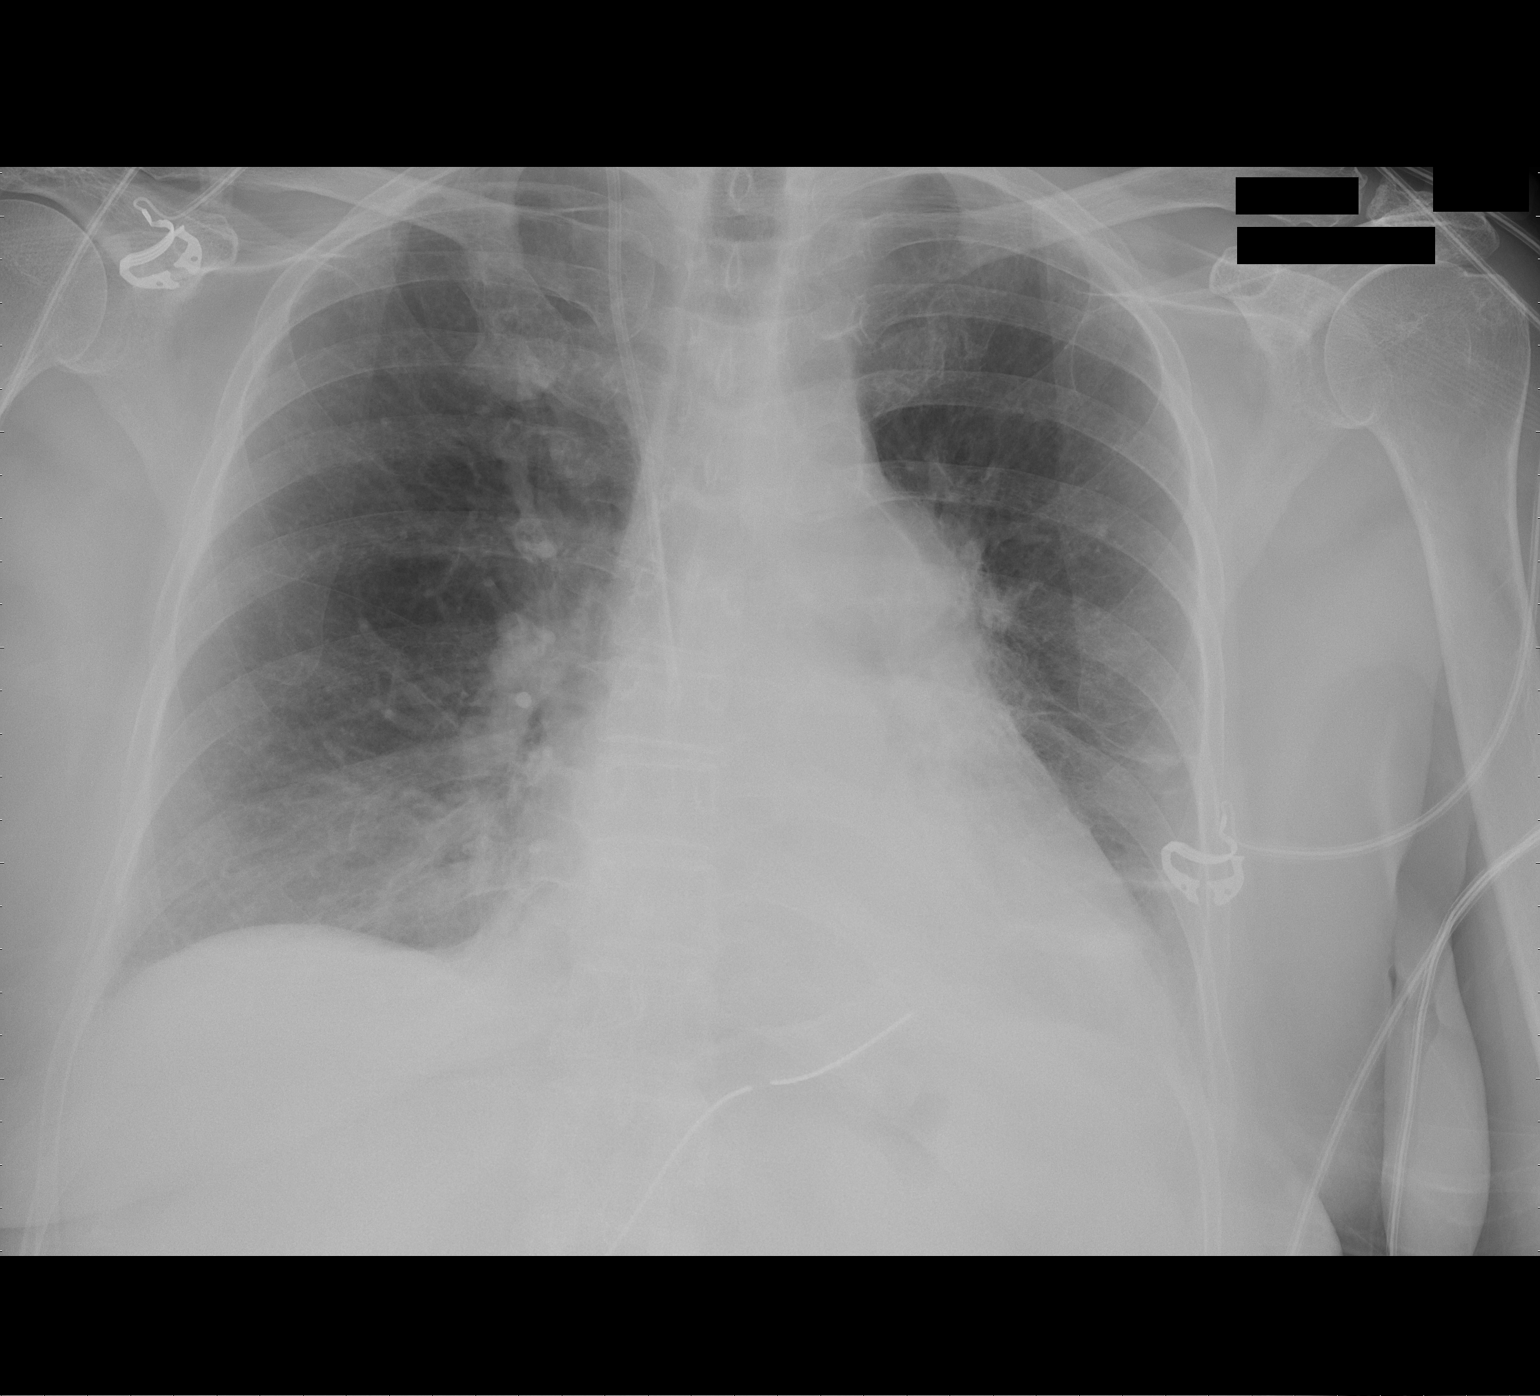

[1 of 1 positions shown; findings below may reference images not displayed]

FINDINGS: The right IJ central line tip is stable at the cavoatrial
junction.  The left basilar chest tube is also unchanged in
position.  No pneumothorax.  Mild cardiomegaly is unchanged.  The
mediastinum and pulmonary vasculature are within normal limits.
Mild left basilar atelectasis is improving.  The right lung is
clear.
IMPRESSION: Stable supportive devices.  No pneumothorax.

Improving left basilar atelectasis.

## 2012-04-24 ENCOUNTER — Other Ambulatory Visit: Payer: Self-pay | Admitting: Cardiology

## 2012-06-09 ENCOUNTER — Encounter: Payer: Self-pay | Admitting: Cardiology

## 2012-06-09 ENCOUNTER — Ambulatory Visit (INDEPENDENT_AMBULATORY_CARE_PROVIDER_SITE_OTHER): Payer: Medicare Other | Admitting: Physician Assistant

## 2012-06-09 VITALS — BP 135/83 | HR 68 | Ht 61.0 in | Wt 157.0 lb

## 2012-06-09 DIAGNOSIS — I471 Supraventricular tachycardia: Secondary | ICD-10-CM

## 2012-06-09 DIAGNOSIS — I498 Other specified cardiac arrhythmias: Secondary | ICD-10-CM

## 2012-06-09 DIAGNOSIS — I313 Pericardial effusion (noninflammatory): Secondary | ICD-10-CM

## 2012-06-09 DIAGNOSIS — I10 Essential (primary) hypertension: Secondary | ICD-10-CM

## 2012-06-09 DIAGNOSIS — I319 Disease of pericardium, unspecified: Secondary | ICD-10-CM

## 2012-06-09 NOTE — Progress Notes (Signed)
Primary Cardiologist: Simona Huh, MD (new)  HPI: Scheduled six-month followup.  Since last seen here in March, 2013, by Dr. Andee Lineman, patient continues to do well since undergoing pericardial window surgery, by Dr. Rexanne Mano, in January 2013, for treatment of chronic pericardial effusion. She does have chronic DOE, attributed to her COPD, and denies any interim exacerbation. She denies any history of exertional CP. She has remote history of "fluttering" and has a diagnosis of SVT, which apparently developed after undergoing pericardiocentesis in October 2012. She denies any recent tachycardia palpitations.  Patient has gained 7 pounds since last OV; however, she denies any development of PND, orthopnea, or peripheral edema.  Allergies  Allergen Reactions  . Ibuprofen     REACTION: throat swelling    Current Outpatient Prescriptions  Medication Sig Dispense Refill  . albuterol (PROVENTIL HFA;VENTOLIN HFA) 108 (90 BASE) MCG/ACT inhaler Inhale 2 puffs into the lungs every 6 (six) hours as needed. Shortness of breath       . ALPRAZolam (XANAX) 0.5 MG tablet Take 0.5 mg by mouth 3 (three) times daily as needed. For anxiety       . COLCRYS 0.6 MG tablet TAKE 1 TABLET BY MOUTH EVERY DAY  30 tablet  1  . dexlansoprazole (DEXILANT) 60 MG capsule Take 60 mg by mouth as needed.      Marland Kitchen HYDROcodone-acetaminophen (VICODIN) 5-500 MG per tablet Take 1 tablet by mouth every 8 (eight) hours as needed.       Marland Kitchen levothyroxine (SYNTHROID, LEVOTHROID) 50 MCG tablet Take 50 mcg by mouth every morning.        . mirtazapine (REMERON) 15 MG tablet Take 15 mg by mouth daily at 8 pm.        . montelukast (SINGULAIR) 10 MG tablet Take 10 mg by mouth daily at 8 pm.        . Multiple Vitamins-Minerals (MULTIVITAL) tablet Take 1 tablet by mouth daily.        . promethazine (PHENERGAN) 25 MG tablet Take 25 mg by mouth every 8 (eight) hours as needed.       . sertraline (ZOLOFT) 50 MG tablet Take 50 mg by mouth daily.         Marland Kitchen tiotropium (SPIRIVA HANDIHALER) 18 MCG inhalation capsule Place 18 mcg into inhaler and inhale daily.          Past Medical History  Diagnosis Date  . 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 Pericardial effusion     Inflammatory - s/p window (Dr. Laneta Simmers)  . Supraventricular tachycardia   . Hypothyroidism   . COPD (chronic obstructive pulmonary disease)   . Anxiety   . GERD (gastroesophageal reflux disease)     Past Surgical History  Procedure Date  . Fibroid tumor resection   . Subxyphoid pericardial window 08/10/2011    Procedure: SUBXYPHOID PERICARDIAL WINDOW;  Surgeon: [name], MD;  Location: [facility] OR;  Service: Open Heart Surgery;  Laterality: N/A;  Subxyphiod Pericardial Window for Drainage of Pericadial Fluid and Chest Tube Placement  . Adenoidectomy   . Cataract surgery     History   Social History  . Marital Status: Divorced    Spouse Name: N/A    Number of Children: N/A  . Years of Education: N/A   Occupational History  . Not on file.   Social History Main Topics  . Smoking status: Former Smoker -- 1.0 packs/day for 30  years    Types: Cigarettes    Quit date: 06/06/2008  . Smokeless tobacco: Never Used  . Alcohol Use: No  . Drug Use: No  . Sexually Active: No   Other Topics Concern  . Not on file   Social History Narrative   Patient had 2 years of college and did well. 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     Family History  Problem Relation Age of Onset  . Lung cancer Father   . Stomach cancer Paternal Aunt     ROS: no nausea, vomiting; no fever, chills; no melena, hematochezia; no claudication  PHYSICAL EXAM: BP 135/83  Pulse 68  Ht 5\' 1"  (1.549 m)  Wt 157 lb (71.215 kg)  BMI 29.67 kg/m2 GENERAL: 66 year-old female; NAD HEENT: NCAT, PERRLA, EOMI; sclera clear; no xanthelasma NECK: palpable  bilateral carotid pulses, no bruits; no JVD; no TM LUNGS:  Diminished breath sounds; no crackles/wheezes CARDIAC: RRR (S1, S2); no significant murmurs; no rubs or gallops ABDOMEN: soft, non-tender; intact BS EXTREMETIES: Trace peripheral edema SKIN: warm/dry; no obvious rash/lesions MUSCULOSKELETAL: no joint deformity NEURO: no focal deficit; NL affect   EKG: reviewed and available in Electronic Records   ASSESSMENT & PLAN:  Pericardial effusion Patient continues to do well since undergoing pericardial window surgery, in January 2013. She presents with no interim development of worsening dyspnea, and continues to report dramatic improvement since her surgery. No current indication for a followup echocardiogram, and we will plan on having her return to the clinic in 6 months, with Dr. Diona Browner, with whom she will establish.  Supraventricular tachycardia No recurrence since undergoing pericardial window surgery.  HYPERTENSION Followed by primary M.D.    Gene Aamori Mcmasters, PAC

## 2012-06-09 NOTE — Assessment & Plan Note (Signed)
Followed by primary M.D. 

## 2012-06-09 NOTE — Assessment & Plan Note (Signed)
Patient continues to do well since undergoing pericardial window surgery, in January 2013. She presents with no interim development of worsening dyspnea, and continues to report dramatic improvement since her surgery. No current indication for a followup echocardiogram, and we will plan on having her return to the clinic in 6 months, with Dr. Diona Browner, with whom she will establish.

## 2012-06-09 NOTE — Patient Instructions (Addendum)

## 2012-06-09 NOTE — Assessment & Plan Note (Signed)
No recurrence since undergoing pericardial window surgery.

## 2012-06-23 ENCOUNTER — Other Ambulatory Visit: Payer: Self-pay | Admitting: Physician Assistant

## 2012-12-31 ENCOUNTER — Encounter: Payer: Self-pay | Admitting: Cardiology

## 2013-01-01 ENCOUNTER — Encounter: Payer: Self-pay | Admitting: Cardiology

## 2013-01-01 ENCOUNTER — Ambulatory Visit (INDEPENDENT_AMBULATORY_CARE_PROVIDER_SITE_OTHER): Payer: Medicare Other | Admitting: Cardiology

## 2013-01-01 VITALS — BP 129/71 | HR 73 | Ht 61.0 in | Wt 162.0 lb

## 2013-01-01 DIAGNOSIS — I498 Other specified cardiac arrhythmias: Secondary | ICD-10-CM

## 2013-01-01 DIAGNOSIS — I313 Pericardial effusion (noninflammatory): Secondary | ICD-10-CM

## 2013-01-01 DIAGNOSIS — I319 Disease of pericardium, unspecified: Secondary | ICD-10-CM

## 2013-01-01 DIAGNOSIS — I471 Supraventricular tachycardia: Secondary | ICD-10-CM

## 2013-01-01 DIAGNOSIS — I10 Essential (primary) hypertension: Secondary | ICD-10-CM

## 2013-01-01 NOTE — Assessment & Plan Note (Signed)
No changes made to current regimen. Keep followup with primary care.

## 2013-01-01 NOTE — Assessment & Plan Note (Signed)
Status post pericardial window for inflammatory effusion, no malignant cells by cytology as noted previously. Symptomatically stable. She is actually going to be having a chest CT done soon for oncology followup, and we will review a copy to make sure she has no substantial degree of residual pericardial fluid. Otherwise will go to an annual followup visit.

## 2013-01-01 NOTE — Assessment & Plan Note (Signed)
Noted after pericardiocentesis in the past. No complaints of palpitations or obvious recurrences. 

## 2013-01-01 NOTE — Progress Notes (Signed)
Clinical Summary Tina [name] is a 67 y.o.female presenting for office followup. She is a former patient of Dr. Andee Lineman, last seen by Mr. Serpe PA-C in November 2013. History reviewed below. She states that other than fatigue, she has been doing reasonably well. No unusual shortness of breath or chest pain. She is under a lot of stress, states that her son is battling with pancreatic cancer. She continues to follow with oncology, actually scheduled to have a followup chest CT soon.  ECG today shows sinus rhythm with left anterior fascicular block and increased voltage.   Allergies  Allergen Reactions  . Ibuprofen     REACTION: throat swelling    Current Outpatient Prescriptions  Medication Sig Dispense Refill  . albuterol (PROVENTIL HFA;VENTOLIN HFA) 108 (90 BASE) MCG/ACT inhaler Inhale 2 puffs into the lungs every 6 (six) hours as needed. Shortness of breath       . ALPRAZolam (XANAX) 0.5 MG tablet Take 0.5 mg by mouth 3 (three) times daily as needed. For anxiety       . levothyroxine (SYNTHROID, LEVOTHROID) 50 MCG tablet Take 50 mcg by mouth every morning.        . mirtazapine (REMERON) 15 MG tablet Take 15 mg by mouth daily at 8 pm.        . montelukast (SINGULAIR) 10 MG tablet Take 10 mg by mouth daily at 8 pm.        . Multiple Vitamins-Minerals (MULTIVITAL) tablet Take 1 tablet by mouth daily.        Marland Kitchen oxycodone (OXY-IR) 5 MG capsule Take 5-10 mg by mouth every 4 (four) hours as needed.      . sertraline (ZOLOFT) 50 MG tablet Take 50 mg by mouth daily.        . simvastatin (ZOCOR) 20 MG tablet Take 20 mg by mouth every evening.      . tiotropium (SPIRIVA HANDIHALER) 18 MCG inhalation capsule Place 18 mcg into inhaler and inhale daily.         No current facility-administered medications for this visit.    Past Medical History  Diagnosis Date  . 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 Pericardial effusion    Inflammatory - s/p window (Dr. Laneta Simmers)  . Supraventricular tachycardia   . Hypothyroidism   . COPD (chronic obstructive pulmonary disease)   . Anxiety   . GERD (gastroesophageal reflux disease)     Past Surgical History  Procedure Laterality Date  . Fibroid tumor resection    . Subxyphoid pericardial window  08/10/2011    Procedure: SUBXYPHOID PERICARDIAL WINDOW;  Surgeon: [name], MD;  Location: [facility] OR;  Service: Open Heart Surgery;  Laterality: N/A;  Subxyphiod Pericardial Window for Drainage of Pericadial Fluid and Chest Tube Placement  . Adenoidectomy    . Cataract surgery      Social History Ms. Sacca reports that she quit smoking about 4 years ago. Her smoking use included Cigarettes. She has a 30 pack-year smoking history. She has never used smokeless tobacco. Ms. Lenig reports that she does not drink alcohol.  Review of Systems No palpitations or syncope. Stable appetite. Otherwise negative.  Physical Examination Filed Vitals:   01/01/13 1314  BP: 129/71  Pulse: 73   Filed Weights   01/01/13 1314  Weight: 162 lb (73.483 kg)   Comfortable. HEENT: Conjunctiva and lids normal, oropharynx clear. Neck: Supple, no elevated JVP or carotid bruits, no  thyromegaly. Lungs: Clear to auscultation, nonlabored breathing at rest. Cardiac: Regular rate and rhythm, no S3 or significant systolic murmur, no pericardial rub. Abdomen: Soft, nontender, bowel sounds present. Extremities: No pitting edema, distal pulses 2+. Skin: Warm and dry. Musculoskeletal: No kyphosis. Neuropsychiatric: Alert and oriented x3, affect grossly appropriate.   Problem List and Plan   Pericardial effusion Status post pericardial window for inflammatory effusion, no malignant cells by cytology as noted previously. Symptomatically stable. She is actually going to be having a chest CT done soon for oncology followup, and we will review a copy to make sure she has no substantial degree of residual  pericardial fluid. Otherwise will go to an annual followup visit.  Essential hypertension, benign No changes made to current regimen. Keep followup with primary care.  Supraventricular tachycardia Noted after pericardiocentesis in the past. No complaints of palpitations or obvious recurrences.    Jonelle Sidle, M.D., F.A.C.C.

## 2013-01-01 NOTE — Patient Instructions (Addendum)

## 2013-01-22 DIAGNOSIS — Z85118 Personal history of other malignant neoplasm of bronchus and lung: Secondary | ICD-10-CM

## 2013-01-22 DIAGNOSIS — E039 Hypothyroidism, unspecified: Secondary | ICD-10-CM

## 2013-01-22 DIAGNOSIS — J45909 Unspecified asthma, uncomplicated: Secondary | ICD-10-CM

## 2013-01-30 ENCOUNTER — Encounter: Payer: Self-pay | Admitting: Cardiology

## 2013-12-22 ENCOUNTER — Encounter: Payer: Self-pay | Admitting: Cardiology

## 2013-12-22 ENCOUNTER — Ambulatory Visit (INDEPENDENT_AMBULATORY_CARE_PROVIDER_SITE_OTHER): Payer: Medicare Other | Admitting: Cardiology

## 2013-12-22 VITALS — BP 143/73 | HR 70 | Ht 60.0 in | Wt 164.1 lb

## 2013-12-22 DIAGNOSIS — I313 Pericardial effusion (noninflammatory): Secondary | ICD-10-CM

## 2013-12-22 DIAGNOSIS — I471 Supraventricular tachycardia: Secondary | ICD-10-CM

## 2013-12-22 DIAGNOSIS — I498 Other specified cardiac arrhythmias: Secondary | ICD-10-CM

## 2013-12-22 DIAGNOSIS — I3139 Other pericardial effusion (noninflammatory): Secondary | ICD-10-CM

## 2013-12-22 DIAGNOSIS — I319 Disease of pericardium, unspecified: Secondary | ICD-10-CM

## 2013-12-22 NOTE — Assessment & Plan Note (Signed)
Noted after pericardiocentesis in the past. No complaints of palpitations or obvious recurrences.

## 2013-12-22 NOTE — Progress Notes (Signed)
Clinical Summary Tina [name] is a 68 y.o.female last seen in May 2014. She reports no palpitations or chest pain. She will be seeing Dr. Jacquiline Doe for oncology followup in June.  CT scan of the chest from June 2014 showed no evidence of pericardial effusion, small left pleural effusion noted, and a new tiny nodule in the right lower lobe measuring 2-3 mm. She is followed by oncology.  ECG today shows sinus rhythm with left anterior fascicular block.  Allergies  Allergen Reactions  . Ibuprofen     REACTION: throat swelling    Current Outpatient Prescriptions  Medication Sig Dispense Refill  . albuterol (PROVENTIL HFA;VENTOLIN HFA) 108 (90 BASE) MCG/ACT inhaler Inhale 2 puffs into the lungs every 6 (six) hours as needed. Shortness of breath       . ALPRAZolam (XANAX) 0.5 MG tablet Take 0.5 mg by mouth 3 (three) times daily as needed. For anxiety       . levothyroxine (SYNTHROID, LEVOTHROID) 50 MCG tablet Take 50 mcg by mouth every morning.        . mirtazapine (REMERON) 15 MG tablet Take 15 mg by mouth daily at 8 pm.        . montelukast (SINGULAIR) 10 MG tablet Take 10 mg by mouth daily at 8 pm.        . Multiple Vitamins-Minerals (MULTIVITAL) tablet Take 1 tablet by mouth daily.        Marland Kitchen oxycodone (OXY-IR) 5 MG capsule Take 5-10 mg by mouth every 4 (four) hours as needed.      . sertraline (ZOLOFT) 100 MG tablet Take 100 mg by mouth every morning.      . simvastatin (ZOCOR) 20 MG tablet Take 20 mg by mouth every evening.      . tiotropium (SPIRIVA HANDIHALER) 18 MCG inhalation capsule Place 18 mcg into inhaler and inhale daily.         No current facility-administered medications for this visit.    Past Medical History  Diagnosis Date  . 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 Pericardial effusion     Inflammatory - s/p window (Dr. Cyndia Bent)  . Supraventricular tachycardia   . Hypothyroidism   . COPD (chronic  obstructive pulmonary disease)   . Anxiety   . GERD (gastroesophageal reflux disease)     Social History Ms. Elliott reports that she quit smoking about 5 years ago. Her smoking use included Cigarettes. She has a 30 pack-year smoking history. She has never used smokeless tobacco. Ms. Geppert reports that she does not drink alcohol.  Review of Systems Chronic fatigue. History of depression. Intermittent ankle edema, right worse on the left. Otherwise negative except as outlined.  Physical Examination Filed Vitals:   12/22/13 1353  BP: 143/73  Pulse: 70   Filed Weights   12/22/13 1353  Weight: 164 lb 1.9 oz (74.444 kg)    Comfortable.  HEENT: Conjunctiva and lids normal, oropharynx clear.  Neck: Supple, no elevated JVP or carotid bruits, no thyromegaly.  Lungs: Clear to auscultation, nonlabored breathing at rest.  Cardiac: Regular rate and rhythm, no S3 or significant systolic murmur, no pericardial rub.  Abdomen: Soft, nontender, bowel sounds present.  Extremities: No pitting edema, distal pulses 2+.  Skin: Warm and dry.  Musculoskeletal: No kyphosis.  Neuropsychiatric: Alert and oriented x3, affect grossly appropriate.   Problem List and Plan   Pericardial effusion Status post pericardial window for inflammatory  effusion, no malignant cells by cytology as noted previously. Symptomatically stable. Examination is unchanged, CT of the chest from last year showed no recurrent pericardial effusion. She will have a followup study soon with oncology visit. If this remains stable after visit next year, no further workup needed.  Supraventricular tachycardia Noted after pericardiocentesis in the past. No complaints of palpitations or obvious recurrences.    Satira Sark, M.D., F.A.C.C.

## 2013-12-22 NOTE — Assessment & Plan Note (Signed)
Status post pericardial window for inflammatory effusion, no malignant cells by cytology as noted previously. Symptomatically stable. Examination is unchanged, CT of the chest from last year showed no recurrent pericardial effusion. She will have a followup study soon with oncology visit. If this remains stable after visit next year, no further workup needed.

## 2013-12-22 NOTE — Patient Instructions (Signed)

## 2015-03-09 ENCOUNTER — Ambulatory Visit: Payer: Medicare Other | Admitting: Cardiology

## 2015-04-05 ENCOUNTER — Encounter: Payer: Self-pay | Admitting: Cardiology

## 2015-04-05 ENCOUNTER — Ambulatory Visit (INDEPENDENT_AMBULATORY_CARE_PROVIDER_SITE_OTHER): Payer: Medicare Other | Admitting: Cardiology

## 2015-04-05 VITALS — BP 144/77 | HR 67 | Ht 60.0 in | Wt 164.0 lb

## 2015-04-05 DIAGNOSIS — I471 Supraventricular tachycardia: Secondary | ICD-10-CM | POA: Diagnosis not present

## 2015-04-05 DIAGNOSIS — Z85118 Personal history of other malignant neoplasm of bronchus and lung: Secondary | ICD-10-CM

## 2015-04-05 DIAGNOSIS — I3139 Other pericardial effusion (noninflammatory): Secondary | ICD-10-CM

## 2015-04-05 DIAGNOSIS — I319 Disease of pericardium, unspecified: Secondary | ICD-10-CM

## 2015-04-05 DIAGNOSIS — I313 Pericardial effusion (noninflammatory): Secondary | ICD-10-CM

## 2015-04-05 NOTE — Patient Instructions (Signed)
Continue all current medications. Your physician wants you to follow up in:  1 year.  You will receive a reminder letter in the mail one-two months in advance.  If you don't receive a letter, please call our office to schedule the follow up appointment   

## 2015-04-05 NOTE — Progress Notes (Signed)
Cardiology Office Note  Date: 04/05/2015   ID: Tina [name], Tina [name] 30-Aug-1945, MRN [id number]  PCP: HAMA Mamie Nick., MD  Primary Cardiologist: Rozann Lesches, MD   Chief Complaint  Patient presents with  . History of SVT  . History of pericardial effusion    History of Present Illness: Tina [name] is a 69 y.o. female last seen in May 2015. She presents for a routine follow-up visit. She reports only occasional sense of brief "fluttering" with no prolonged palpitations or syncope. Her follow-up ECG today is reviewed below.  She does tell me that she has had intermittent leg and ankle edema, does not seem to bother her very much however. She also reports a soreness in her chest and upper back when she works out in the yard, worse in certain positions, sounds inflammatory or musculoskeletal rather than anginal.  She continues to follow with Dr. Jacquiline Doe with history of lung cancer status post chemotherapy and radiation, remains in remission. She did have a recent CT scan of the chest and labwork done at [facility], records being scanned into the chart.   Past Medical History  Diagnosis Date  . 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 Pericardial effusion     Inflammatory - s/p window (Dr. Cyndia Bent)  . Supraventricular tachycardia   . Hypothyroidism   . COPD (chronic obstructive pulmonary disease)   . Anxiety   . GERD (gastroesophageal reflux disease)     Past Surgical History  Procedure Laterality Date  . Fibroid tumor resection    . Subxyphoid pericardial window  08/10/2011    Procedure: SUBXYPHOID PERICARDIAL WINDOW;  Surgeon: Gaye Pollack, MD;  Location: [facility];  Service: Open Heart Surgery;  Laterality: N/A;  Subxyphiod Pericardial Window for Drainage of Pericadial Fluid and Chest Tube Placement  . Adenoidectomy    . Cataract surgery      Current Outpatient Prescriptions  Medication Sig Dispense  Refill  . albuterol (PROVENTIL HFA;VENTOLIN HFA) 108 (90 BASE) MCG/ACT inhaler Inhale 2 puffs into the lungs every 6 (six) hours as needed. Shortness of breath     . levothyroxine (SYNTHROID, LEVOTHROID) 50 MCG tablet Take 50 mcg by mouth every morning.      . mirtazapine (REMERON) 15 MG tablet Take 15 mg by mouth daily at 8 pm.      . montelukast (SINGULAIR) 10 MG tablet Take 10 mg by mouth daily at 8 pm.      . sertraline (ZOLOFT) 100 MG tablet Take 100 mg by mouth every morning.    . simvastatin (ZOCOR) 20 MG tablet Take 20 mg by mouth every evening.    . tiotropium (SPIRIVA HANDIHALER) 18 MCG inhalation capsule Place 18 mcg into inhaler and inhale daily.       No current facility-administered medications for this visit.    Allergies:  Ibuprofen   Social History: The patient  reports that she quit smoking about 6 years ago. Her smoking use included Cigarettes. She has a 30 pack-year smoking history. She has never used smokeless tobacco. She reports that she does not drink alcohol or use illicit drugs.   ROS:  Please see the history of present illness. Otherwise, complete review of systems is positive for weight gain over the years.  All other systems are reviewed and negative.   Physical Exam: VS:  BP 144/77 mmHg  Pulse 67  Ht 5' (1.524 m)  Wt 164  lb (74.39 kg)  BMI 32.03 kg/m2  SpO2 97%, BMI Body mass index is 32.03 kg/(m^2).  Wt Readings from Last 3 Encounters:  04/05/15 164 lb (74.39 kg)  12/22/13 164 lb 1.9 oz (74.444 kg)  01/01/13 162 lb (73.483 kg)     Appears comfortable.  HEENT: Conjunctiva and lids normal, oropharynx clear.  Neck: Supple, no elevated JVP or carotid bruits, no thyromegaly.  Lungs: Clear to auscultation, nonlabored breathing at rest.  Cardiac: Regular rate and rhythm, no S3, no pericardial rub or knock. Abdomen: Soft, nontender, bowel sounds present.  Extremities: Trace ankle edema, distal pulses 2+.  Skin: Warm and dry.  Musculoskeletal: No  kyphosis.  Neuropsychiatric: Alert and oriented x3, affect grossly appropriate. Decreased hearing.   ECG: ECG is ordered today shows sinus rhythm with left anterior fascicular block, septal Q waves.  Assessment and Plan:  1. History of SVT, continue observation with no progressing symptoms. ECG reviewed above.  2. History of inflammatory pericardial effusion status post window in 2013. We will follow-up recent chest CT results done at [facility]. I did explain that if she starts to experience worsening leg edema, we would likely proceed with a follow-up echocardiogram. She will keep it on this for now.  3. History of lung cancer status post radiation and chemotherapy, has been in remission. Keep follow-up with oncology.  Current medicines were reviewed with the patient today.   Orders Placed This Encounter  Procedures  . EKG 12-Lead    Disposition: FU with me in 1 year.   Signed, Satira Sark, MD, [facility] 04/05/2015 4:03 PM    [facility] at [address], [place], [place] [address] Phone: [phone]; Fax: [phone]

## 2016-08-21 ENCOUNTER — Ambulatory Visit (INDEPENDENT_AMBULATORY_CARE_PROVIDER_SITE_OTHER): Payer: Medicare Other | Admitting: Cardiology

## 2016-08-21 ENCOUNTER — Encounter: Payer: Self-pay | Admitting: Cardiology

## 2016-08-21 VITALS — BP 159/78 | HR 74 | Ht 60.0 in | Wt 158.4 lb

## 2016-08-21 DIAGNOSIS — I3139 Other pericardial effusion (noninflammatory): Secondary | ICD-10-CM

## 2016-08-21 DIAGNOSIS — I471 Supraventricular tachycardia: Secondary | ICD-10-CM

## 2016-08-21 DIAGNOSIS — I313 Pericardial effusion (noninflammatory): Secondary | ICD-10-CM

## 2016-08-21 NOTE — Patient Instructions (Signed)
Your physician wants you to follow-up in: Tina [name] will receive a reminder letter in the mail two months in advance. If you don't receive a letter, please call our office to schedule the follow-up appointment.  Your physician recommends that you continue on your current medications as directed. Please refer to the Current Medication list given to you today.  Thank you for choosing [facility]!!

## 2016-08-21 NOTE — Progress Notes (Signed)
Cardiology Office Note  Date: 08/21/2016   ID: LAWRIE [name], DOB 1945-10-23, MRN [id number]  PCP: Lanier Clam, MD  Primary Cardiologist: Rozann Lesches, MD   Chief Complaint  Patient presents with  . History of SVT    History of Present Illness: Tina [name] is a 71 y.o. female last seen in August 2016. She presents for a follow-up visit. She reports only a brief sense of palpitations occasionally, no unusual shortness of breath or chest pain.  I reviewed her medications which are outlined below. She continues to follow with [facility] for primary care. She states that she had a recent chest CT done in follow-up, I have access to the previous one she had done at [facility] in 2016.  I reviewed her ECG today which shows sinus rhythm with left anterior fascicular block and decreased R wave progression.  Past Medical History:  Diagnosis Date  . Anxiety   . COPD (chronic obstructive pulmonary disease) ([place])   . GERD (gastroesophageal reflux disease)   . Hypothyroidism   . 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 Pericardial effusion    Inflammatory - s/p window (Dr. Cyndia Bent)  . Supraventricular tachycardia [facility])     Past Surgical History:  Procedure Laterality Date  . ADENOIDECTOMY    . Cataract surgery    . Fibroid tumor resection    . SUBXYPHOID PERICARDIAL WINDOW  08/10/2011   Procedure: SUBXYPHOID PERICARDIAL WINDOW;  Surgeon: Gaye Pollack, MD;  Location: [facility];  Service: Open Heart Surgery;  Laterality: N/A;  Subxyphiod Pericardial Window for Drainage of Pericadial Fluid and Chest Tube Placement    Current Outpatient Prescriptions  Medication Sig Dispense Refill  . albuterol (VENTOLIN HFA) 108 (90 Base) MCG/ACT inhaler Inhale 2 puffs into the lungs every 6 (six) hours as needed for wheezing or shortness of breath.    . ALPRAZolam (XANAX) 0.5 MG tablet Take by mouth as needed.     Tina [name]  levothyroxine (SYNTHROID, LEVOTHROID) 50 MCG tablet Take 50 mcg by mouth every morning.      . mirtazapine (REMERON) 15 MG tablet Take 15 mg by mouth daily at 8 pm.      . montelukast (SINGULAIR) 10 MG tablet Take 10 mg by mouth daily at 8 pm.      . sertraline (ZOLOFT) 100 MG tablet Take 100 mg by mouth every morning.    . simvastatin (ZOCOR) 20 MG tablet Take 20 mg by mouth every evening.    . tiotropium (SPIRIVA HANDIHALER) 18 MCG inhalation capsule Place 18 mcg into inhaler and inhale daily.       No current facility-administered medications for this visit.    Allergies:  Ibuprofen   Social History: The patient  reports that she quit smoking about 8 years ago. Her smoking use included Cigarettes. She has a 30.00 pack-year smoking history. She has never used smokeless tobacco. She reports that she does not drink alcohol or use drugs.   Family History: The patient's family history includes Lung cancer in her father; Stomach cancer in her paternal aunt.   ROS:  Please see the history of present illness. Otherwise, complete review of systems is positive for hearing loss.  All other systems are reviewed and negative.   Physical Exam: VS:  BP (!) 159/78   Pulse 74   Ht 5' (1.524 m)   Wt 158 lb 6.4 oz (71.8 kg)   SpO2  98%   BMI 30.94 kg/m , BMI Body mass index is 30.94 kg/m.  Wt Readings from Last 3 Encounters:  08/21/16 158 lb 6.4 oz (71.8 kg)  04/05/15 164 lb (74.4 kg)  12/22/13 164 lb 1.9 oz (74.4 kg)    Appears comfortable.  HEENT: Conjunctiva and lids normal, oropharynx clear.  Neck: Supple, no elevated JVP or carotid bruits, no thyromegaly.  Lungs: Clear to auscultation, nonlabored breathing at rest.  Cardiac: Regular rate and rhythm, no S3, no pericardial rub or knock. Abdomen: Soft, nontender, bowel sounds present.  Extremities: Trace ankle edema, distal pulses 2+.  Skin: Warm and dry.  Musculoskeletal: No kyphosis.  Neuropsychiatric: Alert and oriented x3,  affect grossly appropriate. Decreased hearing.  ECG: I personally reviewed the tracing from 04/05/2015 which showed sinus rhythm with left anterior fascicular block, septal Q waves.  Recent Labwork:  July 2016: BUN 15, creatinine 0.8, AST 15, ALT 8, potassium 3.9, hemoglobin 12.2, platelets 230  January 2018: TSH 4.2, hemoglobin 13.2, platelets 224 November 2017: Potassium 4.3, BUN 20, creatinine 0.9, AST 20, ALT 13  Other Studies Reviewed Today:  Chest CT 02/22/2015 [facility]): No obvious tumor recurrence, small scattered pulmonary nodules better overall nonspecific, no pericardial effusion.  Assessment and Plan:  1. History of PSVT, relatively quiescent under observation. She is not on any specific medications for this at this time.  2. History of inflammatory pericardial effusion status post pericardial window in 2013. No obvious recurrence. She has follow-up chest CT imaging with prior history of lung cancer.  3. Elevated blood pressure. Patient states she is upset, grieving the loss of one of her cats recently. I have asked her to keep an eye on blood pressure trend with her PCP.  Current medicines were reviewed with the patient today.   Orders Placed This Encounter  Procedures  . EKG 12-Lead    Disposition: Follow-up in one year.  Signed, Satira Sark, MD, [facility] 08/21/2016 1:31 PM    [facility] at [place], [place], [place] [address] Phone: [phone]; Fax: [phone]

## 2016-11-04 HISTORY — PX: MASTECTOMY: SHX3

## 2017-05-06 ENCOUNTER — Encounter: Payer: Self-pay | Admitting: Cardiology

## 2017-05-06 ENCOUNTER — Ambulatory Visit (INDEPENDENT_AMBULATORY_CARE_PROVIDER_SITE_OTHER): Payer: Medicare Other | Admitting: Cardiology

## 2017-05-06 VITALS — BP 136/78 | HR 67 | Ht 60.0 in | Wt 162.0 lb

## 2017-05-06 DIAGNOSIS — Z85118 Personal history of other malignant neoplasm of bronchus and lung: Secondary | ICD-10-CM | POA: Diagnosis not present

## 2017-05-06 DIAGNOSIS — J449 Chronic obstructive pulmonary disease, unspecified: Secondary | ICD-10-CM

## 2017-05-06 DIAGNOSIS — Z01810 Encounter for preprocedural cardiovascular examination: Secondary | ICD-10-CM

## 2017-05-06 DIAGNOSIS — I471 Supraventricular tachycardia: Secondary | ICD-10-CM

## 2017-05-06 NOTE — Progress Notes (Signed)
Cardiology Office Note  Date: 05/06/2017   ID: Sherae, Santino 06-08-1946, MRN [id number]  PCP: System, Provider Not In  Primary Cardiologist: Rozann Lesches, MD   Chief Complaint  Patient presents with  . Preoperative cardiac evaluation    History of Present Illness: EBONI COVAL is a 71 y.o. female last seen in January of this year. She presents to the office as part of a preoperative evaluation. She is being considered for vaginal sling and cystourethroscopy under general anesthesia by Dr. Genevie Ann. She was referred after preanesthesia evaluation found an abnormal ECG. On discussion today she reports chronic stable dyspnea on exertion which she attributes to her lungs, history of COPD and bronchitis. She remains on MDIs and follows with her PCP for this. Otherwise, she describes only rare palpitations, generally no more than a few seconds at a time, no exertional chest pain, no lightheadedness or syncope. She states that did do some gardening over the summer, functional with basic ADLs, and reports activities that do achieve 4 METs.  I personally reviewed the recent tracing from 04/23/2017 which shows sinus rhythm with left anterior fascicular block, IVCD with increased voltage, repolarization changes. This is relatively stable in comparison to her prior tracing.  I reviewed her medications which are outlined below. She is not on any AV nodal blockers or beta blockers, has a prior history of SVT but this is been relatively quiescent.  Past Medical History:  Diagnosis Date  . Anxiety   . COPD (chronic obstructive pulmonary disease) ([facility])   . GERD (gastroesophageal reflux disease)   . Hypothyroidism   . 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 Pericardial effusion    Inflammatory - s/p window (Dr. Cyndia Bent)  . Supraventricular tachycardia [facility])     Past Surgical History:  Procedure Laterality Date    . ADENOIDECTOMY    . Cataract surgery    . Fibroid tumor resection    . SUBXYPHOID PERICARDIAL WINDOW  08/10/2011   Procedure: SUBXYPHOID PERICARDIAL WINDOW;  Surgeon: Gaye Pollack, MD;  Location: [facility];  Service: Open Heart Surgery;  Laterality: N/A;  Subxyphiod Pericardial Window for Drainage of Pericadial Fluid and Chest Tube Placement    Current Outpatient Prescriptions  Medication Sig Dispense Refill  . albuterol (VENTOLIN HFA) 108 (90 Base) MCG/ACT inhaler Inhale 2 puffs into the lungs every 6 (six) hours as needed for wheezing or shortness of breath.    . ALPRAZolam (XANAX) 0.5 MG tablet Take by mouth as needed.     Marland Kitchen levothyroxine (SYNTHROID, LEVOTHROID) 50 MCG tablet Take 50 mcg by mouth every morning.      . mirtazapine (REMERON) 15 MG tablet Take 15 mg by mouth daily at 8 pm.      . montelukast (SINGULAIR) 10 MG tablet Take 10 mg by mouth daily at 8 pm.      . sertraline (ZOLOFT) 100 MG tablet Take 100 mg by mouth every morning.    . simvastatin (ZOCOR) 20 MG tablet Take 20 mg by mouth every evening.    . tiotropium (SPIRIVA HANDIHALER) 18 MCG inhalation capsule Place 18 mcg into inhaler and inhale daily.       No current facility-administered medications for this visit.    Allergies:  Ibuprofen   Social History: The patient  reports that she quit smoking about 8 years ago. Her smoking use included Cigarettes. She has a 30.00 pack-year smoking history.  She has never used smokeless tobacco. She reports that she does not drink alcohol or use drugs.   Family History: The patient's family history includes Lung cancer in her father; Stomach cancer in her paternal aunt.   ROS:  Please see the history of present illness. Otherwise, complete review of systems is positive for hearing loss, uses hearing aids.  All other systems are reviewed and negative.   Physical Exam: VS:  BP 136/78 (BP Location: Left Arm)   Pulse 67   Ht 5' (1.524 m)   Wt 162 lb (73.5 kg)   SpO2 94%   BMI  31.64 kg/m , BMI Body mass index is 31.64 kg/m.  Wt Readings from Last 3 Encounters:  05/06/17 162 lb (73.5 kg)  08/21/16 158 lb 6.4 oz (71.8 kg)  04/05/15 164 lb (74.4 kg)    General: Patient appears comfortable at rest. HEENT: Conjunctiva and lids normal, oropharynx clear. Neck: Supple, no elevated JVP or carotid bruits, no thyromegaly. Lungs: Decreased breath sounds without wheezing, nonlabored breathing at rest. Cardiac: Regular rate and rhythm, no S3 or significant systolic murmur, no pericardial rub. Abdomen: Soft, nontender, bowel sounds present. Extremities: No pitting edema, distal pulses 2+. Skin: Warm and dry. Musculoskeletal: No kyphosis. Neuropsychiatric: Alert and oriented x3, affect grossly appropriate.  ECG: I personally reviewed the tracing from 08/21/2016 which showed sinus rhythm with left anterior fascicular block, IVCD with repolarization changes.  Recent Labwork:  September 2018: Hemoglobin 13.1, platelets 226, BUN 16, creatinine 0.93, potassium 4.1, hemoglobin A1c 5.4  Assessment and Plan:  1. Preoperative cardiac evaluation in a 71 year old woman with a history of PSVT that has been relatively quiescent and not requiring standing medical therapy. ECG abnormalities are chronic on review of serial tracings. She does not report any progressive palpitations, no dizziness or syncope. She describes activities achieving 4 METs without limiting chest pain and has had stable dyspnea on exertion attributable to her COPD and bronchitis for which she follows with PCP. At this point would not anticipation of additional cardiac testing. She should be able to proceed with planned surgery and acceptable perioperative cardiac risk.  2. COPD and bronchitis with chronic dyspnea on exertion. She is on Spiriva and Ventolin MDIs. She follows with PCP for management.  3. Previous history of pericardial effusion that was inflammatory, status post pericardial window. She has had no  obvious recurrences on subsequent chest imaging studies.  4. 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 She has been in remission based on available information.  Current medicines were reviewed with the patient today.  Disposition: Follow-up in one year, sooner if needed.  Signed, Satira Sark, MD, [facility] 05/06/2017 3:46 PM    [facility] at [facility] [address] [place], [place] [address] Phone: [phone]; Fax: [phone]

## 2017-05-06 NOTE — Patient Instructions (Signed)
Your physician wants you to follow-up in: 1 year Dr.McDowell You will receive a reminder letter in the mail two months in advance. If you don't receive a letter, please call our office to schedule the follow-up appointment.    Your physician recommends that you continue on your current medications as directed. Please refer to the Current Medication list given to you today.     If you need a refill on your cardiac medications before your next appointment, please call your pharmacy.      No lab work or tests ordered today.      Thank you for choosing [facility] !

## 2018-10-14 ENCOUNTER — Ambulatory Visit (INDEPENDENT_AMBULATORY_CARE_PROVIDER_SITE_OTHER): Payer: Medicare Other | Admitting: Cardiology

## 2018-10-14 ENCOUNTER — Encounter: Payer: Self-pay | Admitting: Cardiology

## 2018-10-14 VITALS — BP 120/62 | HR 67 | Ht 59.0 in | Wt 152.0 lb

## 2018-10-14 DIAGNOSIS — I313 Pericardial effusion (noninflammatory): Secondary | ICD-10-CM | POA: Diagnosis not present

## 2018-10-14 DIAGNOSIS — I471 Supraventricular tachycardia: Secondary | ICD-10-CM

## 2018-10-14 DIAGNOSIS — I340 Nonrheumatic mitral (valve) insufficiency: Secondary | ICD-10-CM | POA: Diagnosis not present

## 2018-10-14 DIAGNOSIS — Z853 Personal history of malignant neoplasm of breast: Secondary | ICD-10-CM

## 2018-10-14 DIAGNOSIS — I3139 Other pericardial effusion (noninflammatory): Secondary | ICD-10-CM

## 2018-10-14 NOTE — Patient Instructions (Addendum)
Medication Instructions:   Your physician recommends that you continue on your current medications as directed. Please refer to the Current Medication list given to you today.  Labwork:  NONE  Testing/Procedures: Your physician has requested that you have an echocardiogram. Echocardiography is a painless test that uses sound waves to create images of your heart. It provides your doctor with information about the size and shape of your heart and how well your heart's chambers and valves are working. This procedure takes approximately one hour. There are no restrictions for this procedure.  Follow-Up:  Your physician recommends that you schedule a follow-up appointment in: pending.  Any Other Special Instructions Will Be Listed Below (If Applicable).  If you need a refill on your cardiac medications before your next appointment, please call your pharmacy.

## 2018-10-14 NOTE — Progress Notes (Signed)
Cardiology Office Note  Date: 10/14/2018   ID: Shaya, Reddick Nov 05, 1945, MRN [id number]  PCP: Robley Fries, MD  Primary Cardiologist: Rozann Lesches, MD   Chief Complaint  Patient presents with  . PSVT    History of Present Illness: Tina [name] is a 73 y.o. female last seen in October 2018.  She is here today with her daughter for a follow-up visit.  She has an interval history of breast cancer in March 2019 status post left-sided mastectomy.  She elected not to pursue chemotherapy at that time.  She describes intermittent palpitations, usually only 10 or 15 seconds, no syncope.  This has not escalated in frequency.  I personally reviewed her ECG today which shows sinus rhythm with left anterior fascicular block, IVCD with increased voltage.  She does have a history of PSVT, but has not required standing AV nodal blockers.  Prior history also includes pericardial window for management of inflammatory pericardial effusion back in 2013.  During the time that she was undergoing evaluation for breast cancer last year, she did have an echocardiogram obtained at [facility].  Report indicates LVEF 60 to 65% with normal right ventricular contraction, moderate to severe mitral regurgitation, and mild to moderate tricuspid regurgitation with PASP estimated 66 mmHg.  Past Medical History:  Diagnosis Date  . Anxiety   . COPD (chronic obstructive pulmonary disease) ([place])   . GERD (gastroesophageal reflux disease)   . Hypothyroidism   . 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 Pericardial effusion    Inflammatory - s/p window (Dr. Cyndia Bent)  . Supraventricular tachycardia [facility])     Past Surgical History:  Procedure Laterality Date  . ADENOIDECTOMY    . Cataract surgery    . Fibroid tumor resection    . SUBXYPHOID PERICARDIAL WINDOW  08/10/2011   Procedure: SUBXYPHOID PERICARDIAL WINDOW;  Surgeon: Gaye Pollack, MD;  Location: [facility];  Service: Open Heart Surgery;  Laterality: N/A;  Subxyphiod Pericardial Window for Drainage of Pericadial Fluid and Chest Tube Placement    Current Outpatient Medications  Medication Sig Dispense Refill  . albuterol (VENTOLIN HFA) 108 (90 Base) MCG/ACT inhaler Inhale 2 puffs into the lungs every 6 (six) hours as needed for wheezing or shortness of breath.    . citalopram (CELEXA) 20 MG tablet Take 20 mg by mouth daily.    Marland Kitchen levothyroxine (SYNTHROID, LEVOTHROID) 75 MCG tablet Take 75 mcg by mouth daily before breakfast.    . mirtazapine (REMERON) 15 MG tablet Take 15 mg by mouth daily at 8 pm.      . montelukast (SINGULAIR) 10 MG tablet Take 10 mg by mouth daily at 8 pm.      . oxybutynin (DITROPAN) 5 MG tablet Take 5 mg by mouth 2 (two) times daily.    . sertraline (ZOLOFT) 100 MG tablet Take 100 mg by mouth every morning.    . simvastatin (ZOCOR) 40 MG tablet Take 40 mg by mouth daily.     No current facility-administered medications for this visit.    Allergies:  Ibuprofen   Social History: The patient  reports that she quit smoking about 10 years ago. Her smoking use included cigarettes. She has a 30.00 pack-year smoking history. She has never used smokeless tobacco. She reports that she does not drink alcohol or use drugs.   Family History: The patient's family history includes Lung cancer in her  father; Stomach cancer in her paternal aunt.   ROS:  Please see the history of present illness. Otherwise, complete review of systems is positive for significant hearing loss.  All other systems are reviewed and negative.   Physical Exam: VS:  BP 120/62   Pulse 67   Ht 4\' 11"  (1.499 m)   Wt 152 lb (68.9 kg)   SpO2 96%   BMI 30.70 kg/m , BMI Body mass index is 30.7 kg/m.  Wt Readings from Last 3 Encounters:  10/14/18 152 lb (68.9 kg)  05/06/17 162 lb (73.5 kg)  08/21/16 158 lb 6.4 oz (71.8 kg)    General: Patient appears comfortable at rest. HEENT:  Conjunctiva and lids normal, oropharynx clear. Neck: Supple, no elevated JVP or carotid bruits, no thyromegaly. Lungs: Decreased breath sounds without wheezing, nonlabored breathing at rest. Cardiac: Regular rate and rhythm, no S3, soft systolic murmur, no pericardial rub. Abdomen: Soft, nontender, bowel sounds present. Extremities: No pitting edema, distal pulses 2+. Skin: Warm and dry. Musculoskeletal: No kyphosis. Neuropsychiatric: Alert and oriented x3, affect grossly appropriate.  ECG: I personally reviewed the tracing from 04/23/2017 which showed sinus rhythm with left anterior fascicular block, IVCD, increased voltage with repolarization changes.  Recent Labwork:  10/24/2018: TSH 1.16 July 2018: Potassium 4.0, BUN 15, creatinine 1.11, hemoglobin A1c 5.6%, AST 17, ALT 13, cholesterol 267, triglycerides 132, HDL 57, LDL 184  Other Studies Reviewed Today:  Echocardiogram 08/09/2011: Study Conclusions  - Left ventricle: The cavity size was normal. Wall thickness was normal. Systolic function was normal. The estimated ejection fraction was in the range of 55% to 65%. Doppler parameters are consistent with abnormal left ventricular relaxation (grade 1 diastolic dysfunction). - Aortic valve: Moderate regurgitation. Valve area: 1.79cm^2 (Vmax). - Mitral valve: Mild regurgitation. - Pulmonary arteries: PA peak pressure: 63mm Hg (S). - Pericardium, extracardiac: A large pericardial effusion was identified circumferential to the heart. There was chamber collapse. Features were consistent with moderate tamponade physiology.  Assessment and Plan:  1.  PSVT.  She reports intermittent brief palpitations but nothing progressive.  ECG reviewed today.  Continue with observation.  At present she is not on any AV nodal blockers.  2.  Valvular heart disease by echocardiogram done at [facility] last March.  Moderate to severe mitral regurgitation, and mild to moderate  tricuspid vegetation with severe pulmonary hypertension.  A follow-up echocardiogram will be obtained.  3.  History of inflammatory pericardial effusion status post pericardial window in 2013.  4.  History of breast cancer and small cell lung cancer.  Current medicines were reviewed with the patient today.   Orders Placed This Encounter  Procedures  . EKG 12-Lead  . ECHOCARDIOGRAM COMPLETE    Disposition: Call with test results and determine follow-up plan.  Signed, Satira Sark, MD, [facility] 10/14/2018 1:42 PM    [facility] at [place], [place], [place] [address] Phone: [phone]; Fax: [phone]

## 2018-11-05 ENCOUNTER — Other Ambulatory Visit: Payer: Medicare Other

## 2019-01-14 ENCOUNTER — Other Ambulatory Visit: Payer: Medicare Other

## 2019-02-18 ENCOUNTER — Ambulatory Visit (INDEPENDENT_AMBULATORY_CARE_PROVIDER_SITE_OTHER): Payer: Medicare Other

## 2019-02-18 DIAGNOSIS — I340 Nonrheumatic mitral (valve) insufficiency: Secondary | ICD-10-CM | POA: Diagnosis not present

## 2019-02-20 ENCOUNTER — Telehealth: Payer: Self-pay | Admitting: *Deleted

## 2019-02-20 NOTE — Telephone Encounter (Signed)
Notes recorded by Laurine Blazer, LPN on 04/14/300 at 49:96 AM EDT  Patient & daughter Tina [name]) notified. Copy to pmd. OV scheduled with Dr. Domenic Polite for 04/21/2019 [place] office.  ------   Notes recorded by Erma Heritage, PA-C on 02/18/2019 at 5:04 PM EDT  Covering for Dr. Domenic Polite - Please let the patient know her echocardiogram showed normal pumping function of the heart with a preserved EF of 60 to 65%. The leakage along her mitral valve remains moderate to severe which is similar to her prior studies. Small fluid collection also noted along the pericardium. It does not appear she had a follow-up visit with Dr. Domenic Polite scheduled. Please arrange for follow-up within the next 1-2 months to review findings in more detail. Thank you.

## 2019-04-21 ENCOUNTER — Ambulatory Visit (INDEPENDENT_AMBULATORY_CARE_PROVIDER_SITE_OTHER): Payer: Medicare Other | Admitting: Cardiology

## 2019-04-21 ENCOUNTER — Encounter: Payer: Self-pay | Admitting: Cardiology

## 2019-04-21 ENCOUNTER — Other Ambulatory Visit: Payer: Self-pay

## 2019-04-21 VITALS — BP 132/68 | HR 65 | Ht 59.0 in | Wt 152.4 lb

## 2019-04-21 DIAGNOSIS — M7989 Other specified soft tissue disorders: Secondary | ICD-10-CM

## 2019-04-21 DIAGNOSIS — I340 Nonrheumatic mitral (valve) insufficiency: Secondary | ICD-10-CM | POA: Diagnosis not present

## 2019-04-21 DIAGNOSIS — I3139 Other pericardial effusion (noninflammatory): Secondary | ICD-10-CM

## 2019-04-21 DIAGNOSIS — I471 Supraventricular tachycardia: Secondary | ICD-10-CM | POA: Diagnosis not present

## 2019-04-21 DIAGNOSIS — I313 Pericardial effusion (noninflammatory): Secondary | ICD-10-CM

## 2019-04-21 MED ORDER — FUROSEMIDE 20 MG PO TABS: 20.0000 mg | ORAL_TABLET | Freq: Every day | ORAL | 3 refills | Status: DC

## 2019-04-21 NOTE — Patient Instructions (Addendum)
Medication Instructions:    Your physician has recommended you make the following change in your medication:   Start furosemide 20 mg by mouth daily in the morning  Continue all other medications the same  Labwork:  Your physician recommends that you return for lab work in: 10 days to check your BMET. You may have this non-fasting lab work done at [facility] .  Testing/Procedures:  NONE  Follow-Up:  Your physician recommends that you schedule a follow-up appointment in: 4 months. You will receive a reminder letter in the mail in about 2 months reminding you to call and schedule your appointment. If you don't receive this letter, please contact our office.  Any Other Special Instructions Will Be Listed Below (If Applicable).  If you need a refill on your cardiac medications before your next appointment, please call your pharmacy.

## 2019-04-21 NOTE — Progress Notes (Signed)
Cardiology Office Note  Date: 04/21/2019   ID: Tina [name], Tina [name] Tina [name], MRN [id number]  PCP:  Robley Fries, MD  Cardiologist:  Rozann Lesches, MD Electrophysiologist:  None   Chief Complaint  Patient presents with  . Cardiac follow-up    History of Present Illness: Tina [name] is a 73 y.o. female last seen in March.  She is here with her daughter today.  Main complaint is of right leg swelling and ankle swelling.  Also has had some cramping in her thigh.  This fluctuates depending on whether she is up or has her feet elevated.  Follow-up echocardiogram obtained in July revealed LVEF 60 to 65% with grade 2 diastolic dysfunction, small pericardial effusion, moderate to severe mitral regurgitation, moderate aortic regurgitation, and mild to moderate tricuspid regurgitation with evidence of moderate pulmonary hypertension.  I discussed the results with her daughter today.  We went over her medications.  She has not been on a diuretic, we discussed this today to see if this might help with some of her swelling.  I reviewed her last lab work per PCP from December 2019.  Creatinine was 1.1 at that time.  She does not report any progressive sense of palpitations.  Only brief episodes.  Past Medical History:  Diagnosis Date  . Anxiety   . COPD (chronic obstructive pulmonary disease) ([place])   . GERD (gastroesophageal reflux disease)   . Hypothyroidism   . 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 Pericardial effusion    Inflammatory - s/p window (Dr. Cyndia Bent)  . Supraventricular tachycardia [facility])     Past Surgical History:  Procedure Laterality Date  . ADENOIDECTOMY    . Cataract surgery    . Fibroid tumor resection    . SUBXYPHOID PERICARDIAL WINDOW  08/10/2011   Procedure: SUBXYPHOID PERICARDIAL WINDOW;  Surgeon: Gaye Pollack, MD;  Location: [facility];  Service: Open Heart Surgery;  Laterality: N/A;   Subxyphiod Pericardial Window for Drainage of Pericadial Fluid and Chest Tube Placement    Current Outpatient Medications  Medication Sig Dispense Refill  . albuterol (VENTOLIN HFA) 108 (90 Base) MCG/ACT inhaler Inhale 2 puffs into the lungs every 6 (six) hours as needed for wheezing or shortness of breath.    . citalopram (CELEXA) 20 MG tablet Take 20 mg by mouth daily.    Marland Kitchen levothyroxine (SYNTHROID, LEVOTHROID) 75 MCG tablet Take 75 mcg by mouth daily before breakfast.    . mirtazapine (REMERON) 15 MG tablet Take 15 mg by mouth daily at 8 pm.      . montelukast (SINGULAIR) 10 MG tablet Take 10 mg by mouth daily at 8 pm.      . oxybutynin (DITROPAN) 5 MG tablet Take 5 mg by mouth 2 (two) times daily.    . sertraline (ZOLOFT) 100 MG tablet Take 100 mg by mouth every morning.    . simvastatin (ZOCOR) 40 MG tablet Take 40 mg by mouth daily.    . furosemide (LASIX) 20 MG tablet Take 1 tablet (20 mg total) by mouth daily. 90 tablet 3   No current facility-administered medications for this visit.    Allergies:  Ibuprofen   Social History: The patient  reports that she quit smoking about 10 years ago. Her smoking use included cigarettes. She has a 30.00 pack-year smoking history. She has never used smokeless tobacco. She reports that she does not drink alcohol or use  drugs.   ROS:  Please see the history of present illness. Otherwise, complete review of systems is positive for significant hearing loss.  All other systems are reviewed and negative.   Physical Exam: VS:  BP 132/68   Pulse 65   Ht 4\' 11"  (1.499 m)   Wt 152 lb 6.4 oz (69.1 kg)   SpO2 95%   BMI 30.78 kg/m , BMI Body mass index is 30.78 kg/m.  Wt Readings from Last 3 Encounters:  04/21/19 152 lb 6.4 oz (69.1 kg)  10/14/18 152 lb (68.9 kg)  05/06/17 162 lb (73.5 kg)    General: Elderly woman, appears comfortable at rest. HEENT: Conjunctiva and lids normal, wearing a mask. Neck: Supple, no elevated JVP or carotid bruits, no  thyromegaly. Lungs: Decreased breath sounds, nonlabored breathing at rest. Cardiac: Regular rate and rhythm, no S3, soft systolic murmur. Abdomen: Soft, nontender, bowel sounds present. Extremities: 1-2+ lower leg edema, right worse than left, distal pulses 2+. Skin: Warm and dry. Musculoskeletal: No kyphosis. Neuropsychiatric: Alert and oriented x3, affect grossly appropriate.  Significant hearing loss evident.  ECG:  An ECG dated 10/14/2018 was personally reviewed today and demonstrated:  Sinus rhythm with left anterior fascicular block, increased voltage.  Recent Labwork:  10/24/2018: TSH 1.16 July 2018: Potassium 4.0, BUN 15, creatinine 1.11, hemoglobin A1c 5.6%, AST 17, ALT 13, cholesterol 267, triglycerides 132, HDL 57, LDL 184  Other Studies Reviewed Today:  Echocardiogram 02/18/2019:  1. The left ventricle has normal systolic function with an ejection fraction of 60-65%. The cavity size was normal. Left ventricular diastolic Doppler parameters are consistent with pseudonormalization. Elevated mean left atrial pressure.  2. The right ventricle has normal systolic function. The cavity was mildly enlarged. There is no increase in right ventricular wall thickness.  3. Left atrial size was moderately dilated.  4. Right atrial size was mildly dilated.  5. Small pericardial effusion.  6. The pericardial effusion is localized near the right atrium.  7. Mitral valve regurgitation MR vena contracta is 0.5 cm. The MV/AV VTI ratio is 1.4. Overall findings suggest moderate to severe MR.  8. The aortic valve has an indeterminate number of cusps. Mild thickening of the aortic valve. Mild calcification of the aortic valve. Aortic valve regurgitation is moderate by color flow Doppler. No stenosis of the aortic valve. Mild aortic annular  calcification noted.  9. Tricuspid valve regurgitation is mild-moderate. 10. Mild pulmonic stenosis. 11. The aortic root is normal in size and structure. 12.  Pulmonary hypertension is moderately elevated, PASP is 43 mmHg.  Assessment and Plan:  1.  History of PSVT.  She reports only brief palpitations and at this point would continue with observation.  She is not on any AV nodal blockers.  2.  Valvular heart disease including moderate to severe mitral regurgitation, moderate aortic regurgitation, and mild to moderate tricuspid vegetation with moderate pulmonary hypertension.  We will start low-dose Lasix in light of leg swelling, follow-up BMET in the next 10 days.  Do not anticipate aggressive work-up at this point.  3.  History of inflammatory pericardial effusion status post window in 2013.  She had a small pericardial effusion by recent follow-up echocardiogram.  4.  History of breast cancer and small cell lung cancer.  Medication Adjustments/Labs and Tests Ordered: Current medicines are reviewed at length with the patient today.  Concerns regarding medicines are outlined above.   Tests Ordered: Orders Placed This Encounter  Procedures  . Basic metabolic panel  Medication Changes: Meds ordered this encounter  Medications  . furosemide (LASIX) 20 MG tablet    Sig: Take 1 tablet (20 mg total) by mouth daily.    Dispense:  90 tablet    Refill:  3    04/21/2019 NEW    Disposition:  Follow up 4 months in the [place] office.  Signed, Satira Sark, MD, [facility] 04/21/2019 2:54 PM    [facility] at [place], [place], [place] [address] Phone: [phone]; Fax: [phone]

## 2019-05-06 ENCOUNTER — Encounter: Payer: Self-pay | Admitting: *Deleted

## 2019-05-08 ENCOUNTER — Telehealth: Payer: Self-pay | Admitting: *Deleted

## 2019-05-08 NOTE — Telephone Encounter (Signed)
-----   Message from Satira Sark, MD sent at 05/07/2019  7:51 PM EDT ----- Results reviewed. Mild bump in creatinine from 1.1 to 1.3 on Lasix. Would consider trying to use Lasix every other day for now if that still helps with leg swelling.

## 2019-05-08 NOTE — Telephone Encounter (Signed)
LM to return call.

## 2019-05-13 ENCOUNTER — Telehealth: Payer: Self-pay | Admitting: *Deleted

## 2019-05-13 NOTE — Telephone Encounter (Signed)
Pt voiced understanding - updated medication list 

## 2019-05-13 NOTE — Telephone Encounter (Signed)
-----   Message from Satira Sark, MD sent at 05/07/2019  7:51 PM EDT ----- Results reviewed. Mild bump in creatinine from 1.1 to 1.3 on Lasix. Would consider trying to use Lasix every other day for now if that still helps with leg swelling.

## 2019-05-15 NOTE — Telephone Encounter (Signed)
Daughter informed and verbalized understanding of plan. Copy sent to PCP. 

## 2019-11-18 NOTE — Progress Notes (Addendum)
Cardiology Office Note  Date: 11/19/2019   ID: Tina, [name] [birth year], MRN [id number]  PCP:  Robley Fries, MD  Cardiologist:  Rozann Lesches, MD Electrophysiologist:  None   Chief Complaint: Hx of PSVT, Moderate / Severe MR, Hx of pericardial effusion  History of Present Illness: Tina [name] is a 74 y.o. female with a history of PSVT, Moderate / Severe MR, Hx of pericardial effusion.  Last saw Dr. Domenic Polite 04/21/2019.  At that time she only reported brief palpitations and was not on any AV nodal blockers.  Plans were for continued observation.  She had moderate to severe MR and moderate AR, mild to moderate tricuspid vegetation with moderate pulmonary hypertension on a recent echo.  Low-dose Lasix was started for leg swelling.  History of inflammatory pericardial effusion status post window in 2013.  She had a small  Pericardial effusion on recent echocardiogram.  History of breast CA and small cell lung CA.  Here today for follow-up.  Denies any anginal or exertional symptoms, orthostatic symptoms but does have balance issues which daughter .who is with her, slates is related to radiation to her head from cancer.  Patient is very hard of hearing.  She denies any recent acute illnesses, hospitalizations, surgeries, or travels.  She received her first Covid vaccine and will be having the second dose in a couple of weeks.  She does complain of occasional palpitations that are not bothersome and she is not symptomatic.  Daughter states the lower extremity edema is better since starting diuretics.  Past Medical History:  Diagnosis Date  . Anxiety   . COPD (chronic obstructive pulmonary disease) ([place])   . GERD (gastroesophageal reflux disease)   . Hypothyroidism   . 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 Pericardial effusion    Inflammatory - s/p window (Dr. Cyndia Bent)  . Supraventricular tachycardia [facility])      Past Surgical History:  Procedure Laterality Date  . ADENOIDECTOMY    . Cataract surgery    . Fibroid tumor resection    . MASTECTOMY Left 11/2016  . SUBXYPHOID PERICARDIAL WINDOW  08/10/2011   Procedure: SUBXYPHOID PERICARDIAL WINDOW;  Surgeon: Gaye Pollack, MD;  Location: [facility];  Service: Open Heart Surgery;  Laterality: N/A;  Subxyphiod Pericardial Window for Drainage of Pericadial Fluid and Chest Tube Placement    Current Outpatient Medications  Medication Sig Dispense Refill  . albuterol (VENTOLIN HFA) 108 (90 Base) MCG/ACT inhaler Inhale 2 puffs into the lungs every 6 (six) hours as needed for wheezing or shortness of breath.    . citalopram (CELEXA) 20 MG tablet Take 20 mg by mouth daily.    . furosemide (LASIX) 20 MG tablet Take 20 mg by mouth every other day.    . levothyroxine (SYNTHROID, LEVOTHROID) 75 MCG tablet Take 75 mcg by mouth daily before breakfast.    . mirtazapine (REMERON) 15 MG tablet Take 15 mg by mouth daily at 8 pm.      . montelukast (SINGULAIR) 10 MG tablet Take 10 mg by mouth daily at 8 pm.      . oxybutynin (DITROPAN) 5 MG tablet Take 5 mg by mouth 2 (two) times daily.    . sertraline (ZOLOFT) 100 MG tablet Take 100 mg by mouth every morning.    . simvastatin (ZOCOR) 40 MG tablet Take 40 mg by mouth daily.     No current facility-administered medications  for this visit.   Allergies:  Ibuprofen   Social History: The patient  reports that she quit smoking about 11 years ago. Her smoking use included cigarettes. She has a 30.00 pack-year smoking history. She has never used smokeless tobacco. She reports that she does not drink alcohol or use drugs.   Family History: The patient's family history includes Lung cancer in her father; Stomach cancer in her paternal aunt.   ROS:  Please see the history of present illness. Otherwise, complete review of systems is positive for none.  All other systems are reviewed and negative.   Physical Exam: VS:  BP 124/60    Pulse 64   Ht 4\' 11"  (1.499 m)   Wt 150 lb 3.2 oz (68.1 kg)   SpO2 98%   BMI 30.34 kg/m , BMI Body mass index is 30.34 kg/m.  Wt Readings from Last 3 Encounters:  11/19/19 150 lb 3.2 oz (68.1 kg)  04/21/19 152 lb 6.4 oz (69.1 kg)  10/14/18 152 lb (68.9 kg)    General: Patient appears comfortable at rest. Neck: Supple, no elevated JVP, left carotid bruit, no thyromegaly. Lungs: Clear to auscultation, nonlabored breathing at rest. Cardiac: Regular rate and rhythm, no S3 or 2/6  systolic murmur, no pericardial rub. Extremities: No pitting edema, distal pulses 2+. Skin: Warm and dry. Musculoskeletal: No kyphosis. Neuropsychiatric: Alert and oriented x3, affect grossly appropriate.  ECG:  An ECG dated 11/19/2019  was personally reviewed today and demonstrated:  Sinus rhythm rate of 81  Recent Labwork: No results found for requested labs within last 8760 hours.  No results found for: CHOL, TRIG, HDL, CHOLHDL, VLDL, LDLCALC, LDLDIRECT  Other Studies Reviewed Today:  Echocardiogram 02/18/2019: 1. The left ventricle has normal systolic function with an ejection fraction of 60-65%. The cavity size was normal. Left ventricular diastolic Doppler parameters are consistent with pseudonormalization. Elevated mean left atrial pressure. 2. The right ventricle has normal systolic function. The cavity was mildly enlarged. There is no increase in right ventricular wall thickness. 3. Left atrial size was moderately dilated. 4. Right atrial size was mildly dilated. 5. Small pericardial effusion. 6. The pericardial effusion is localized near the right atrium. 7. Mitral valve regurgitation MR vena contracta is 0.5 cm. The MV/AV VTI ratio is 1.4. Overall findings suggest moderate to severe MR. 8. The aortic valve has an indeterminate number of cusps. Mild thickening of the aortic valve. Mild calcification of the aortic valve. Aortic valve regurgitation is moderate by color flow Doppler. No  stenosis of the aortic valve. Mild aortic annular  calcification noted. 9. Tricuspid valve regurgitation is mild-moderate. 10. Mild pulmonic stenosis. 11. The aortic root is normal in size and structure. 12. Pulmonary hypertension is moderately elevated, PASP is 43 mmHg.  Assessment and Plan:  1. PSVT (paroxysmal supraventricular tachycardia) ([place])   2. Nonrheumatic mitral valve regurgitation   3. Pericardial effusion   4. Left carotid bruit     1. PSVT (paroxysmal supraventricular tachycardia) ([place]) She denies any episodes of PSVT but states she does have occasional palpitations which are usually not bothersome and she is asymptomatic when they occur.  2. Nonrheumatic mitral valve regurgitation  Moderate to severe MR on last echo July 2020. Repeat echocardiogram this year in July to reassess valve function.  Patient states she seems to be having some mild increase in dyspnea on exertion.  She is not very active on a daily basis.   3. Pericardial effusion On last echo there was a small  pericardial effusion next to right atrium.  We are rechecking an echo in July of this year to assess her severe mitral regurgitation.  4.  Left carotid bruit Patient has a left carotid bruit on auscultation today.  Please obtain a carotid artery duplex study.   Medication Adjustments/Labs and Tests Ordered: Current medicines are reviewed at length with the patient today.  Concerns regarding medicines are outlined above.   Disposition: Follow-up with Dr Domenic Polite or APP 6 months Signed, Levell July, NP 11/19/2019 9:41 AM    [facility] [address], [place], [place] [address] Phone: [phone]; Fax: [phone]

## 2019-11-19 ENCOUNTER — Other Ambulatory Visit: Payer: Self-pay

## 2019-11-19 ENCOUNTER — Ambulatory Visit (INDEPENDENT_AMBULATORY_CARE_PROVIDER_SITE_OTHER): Payer: Medicare Other | Admitting: Family Medicine

## 2019-11-19 ENCOUNTER — Encounter: Payer: Self-pay | Admitting: Family Medicine

## 2019-11-19 VITALS — BP 124/60 | HR 64 | Ht 59.0 in | Wt 150.2 lb

## 2019-11-19 DIAGNOSIS — R0989 Other specified symptoms and signs involving the circulatory and respiratory systems: Secondary | ICD-10-CM | POA: Insufficient documentation

## 2019-11-19 DIAGNOSIS — I313 Pericardial effusion (noninflammatory): Secondary | ICD-10-CM

## 2019-11-19 DIAGNOSIS — I3139 Other pericardial effusion (noninflammatory): Secondary | ICD-10-CM

## 2019-11-19 DIAGNOSIS — I340 Nonrheumatic mitral (valve) insufficiency: Secondary | ICD-10-CM

## 2019-11-19 DIAGNOSIS — I471 Supraventricular tachycardia: Secondary | ICD-10-CM | POA: Diagnosis not present

## 2019-11-19 NOTE — Patient Instructions (Signed)
Medication Instructions:   Your physician recommends that you continue on your current medications as directed. Please refer to the Current Medication list given to you today.  Labwork:  NONE  Testing/Procedures: Your physician has requested that you have a carotid duplex. This test is an ultrasound of the carotid arteries in your neck. It looks at blood flow through these arteries that supply the brain with blood. Allow one hour for this exam. There are no restrictions or special instructions. Your physician has requested that you have an echocardiogram in July 2021. Echocardiography is a painless test that uses sound waves to create images of your heart. It provides your doctor with information about the size and shape of your heart and how well your heart's chambers and valves are working. This procedure takes approximately one hour. There are no restrictions for this procedure.  Follow-Up:  Your physician recommends that you schedule a follow-up appointment in:   Any Other Special Instructions Will Be Listed Below (If Applicable).  If you need a refill on your cardiac medications before your next appointment, please call your pharmacy.

## 2019-12-14 ENCOUNTER — Telehealth: Payer: Self-pay | Admitting: Family Medicine

## 2019-12-14 NOTE — Telephone Encounter (Signed)
  Precert needed for:   Echo and Carotid   Location:   [facility]  Date:  January 14, 2020

## 2020-01-14 ENCOUNTER — Ambulatory Visit (INDEPENDENT_AMBULATORY_CARE_PROVIDER_SITE_OTHER): Payer: Medicare Other

## 2020-01-14 ENCOUNTER — Other Ambulatory Visit: Payer: Self-pay

## 2020-01-14 DIAGNOSIS — I340 Nonrheumatic mitral (valve) insufficiency: Secondary | ICD-10-CM

## 2020-01-14 DIAGNOSIS — R0989 Other specified symptoms and signs involving the circulatory and respiratory systems: Secondary | ICD-10-CM | POA: Diagnosis not present

## 2020-01-18 ENCOUNTER — Telehealth: Payer: Self-pay | Admitting: *Deleted

## 2020-01-18 NOTE — Telephone Encounter (Signed)
-----   Message from Verta Ellen., NP sent at 01/17/2020  4:29 PM EDT ----- Please call the patient and tell her the echocardiogram showed good heart pumping function. Her left ventricle is a little stiff and has problems relaxing when it is trying to fill. The best management is taking blood pressure medication and making sure BP stays at or below 130/80. This helps relieve some of the stiffness. She has moderate to severe mitral regurgitation. And moderate aortic regurgitation. Pulmonary pressures are moderately elevated. Tell her this is unchanged since her last echocardiogram. We will keep an eye on this by doing annual echocardiograms. Thanks

## 2020-01-21 ENCOUNTER — Telehealth: Payer: Self-pay | Admitting: Cardiology

## 2020-01-21 NOTE — Telephone Encounter (Signed)
See prior phone note for details.

## 2020-01-21 NOTE — Telephone Encounter (Signed)
-----   Message from Verta Ellen., NP sent at 01/14/2020  7:56 PM EDT ----- Please call the patient and tell her the carotid study did not show any narrowing or stenosis in her carotid arteries. Thank You

## 2020-01-21 NOTE — Telephone Encounter (Signed)
Patient informed. Copy sent to PCP °

## 2020-01-21 NOTE — Telephone Encounter (Signed)
Pt's daughter called stating the patient doesn't hear well over the phone and wanted her daughter to get her results from the testing she had done.  Please call Melissa @ [phone]- work #

## 2020-03-30 ENCOUNTER — Ambulatory Visit: Payer: Medicare Other | Admitting: Cardiology

## 2020-04-01 ENCOUNTER — Ambulatory Visit: Payer: Medicare Other | Admitting: Cardiology

## 2020-12-29 ENCOUNTER — Other Ambulatory Visit: Payer: Self-pay | Admitting: Cardiology

## 2021-02-22 ENCOUNTER — Other Ambulatory Visit: Payer: Self-pay | Admitting: Cardiology

## 2021-08-06 DEATH — deceased
